# Patient Record
Sex: Male | Born: 1970 | Race: Black or African American | Hispanic: No | Marital: Married | Smoking: Never smoker
Health system: Southern US, Community
[De-identification: ages and names within clinical notes are randomized; demographics above are authoritative.]

## PROBLEM LIST (undated history)

## (undated) DIAGNOSIS — E785 Hyperlipidemia, unspecified: Secondary | ICD-10-CM

## (undated) DIAGNOSIS — I10 Essential (primary) hypertension: Secondary | ICD-10-CM

## (undated) HISTORY — DX: Essential (primary) hypertension: I10

## (undated) HISTORY — DX: Hyperlipidemia, unspecified: E78.5

## (undated) HISTORY — PX: ELBOW FRACTURE SURGERY: SHX616

---

## 1999-07-15 ENCOUNTER — Emergency Department (HOSPITAL_COMMUNITY): Admission: EM | Admit: 1999-07-15 | Discharge: 1999-07-15 | Payer: Self-pay | Admitting: Emergency Medicine

## 1999-12-16 ENCOUNTER — Encounter: Payer: Self-pay | Admitting: Emergency Medicine

## 1999-12-16 ENCOUNTER — Emergency Department (HOSPITAL_COMMUNITY): Admission: EM | Admit: 1999-12-16 | Discharge: 1999-12-16 | Payer: Self-pay | Admitting: Emergency Medicine

## 2001-07-05 ENCOUNTER — Emergency Department (HOSPITAL_COMMUNITY): Admission: EM | Admit: 2001-07-05 | Discharge: 2001-07-05 | Payer: Self-pay | Admitting: Emergency Medicine

## 2002-03-11 ENCOUNTER — Emergency Department (HOSPITAL_COMMUNITY): Admission: EM | Admit: 2002-03-11 | Discharge: 2002-03-12 | Payer: Self-pay | Admitting: Emergency Medicine

## 2003-04-01 ENCOUNTER — Encounter: Payer: Self-pay | Admitting: Emergency Medicine

## 2003-04-01 ENCOUNTER — Emergency Department (HOSPITAL_COMMUNITY): Admission: EM | Admit: 2003-04-01 | Discharge: 2003-04-01 | Payer: Self-pay | Admitting: Emergency Medicine

## 2005-06-28 ENCOUNTER — Ambulatory Visit: Payer: Self-pay | Admitting: Internal Medicine

## 2005-07-20 ENCOUNTER — Ambulatory Visit: Payer: Self-pay | Admitting: Internal Medicine

## 2005-09-26 ENCOUNTER — Ambulatory Visit: Payer: Self-pay | Admitting: Internal Medicine

## 2006-03-07 ENCOUNTER — Ambulatory Visit: Payer: Self-pay | Admitting: Internal Medicine

## 2008-04-23 ENCOUNTER — Ambulatory Visit: Payer: Self-pay | Admitting: Internal Medicine

## 2008-04-23 DIAGNOSIS — E119 Type 2 diabetes mellitus without complications: Secondary | ICD-10-CM | POA: Insufficient documentation

## 2008-04-23 DIAGNOSIS — I10 Essential (primary) hypertension: Secondary | ICD-10-CM | POA: Insufficient documentation

## 2008-04-23 LAB — CONVERTED CEMR LAB: Glucose, Bld: 180 mg/dL

## 2008-04-28 ENCOUNTER — Telehealth (INDEPENDENT_AMBULATORY_CARE_PROVIDER_SITE_OTHER): Payer: Self-pay | Admitting: *Deleted

## 2008-04-28 LAB — CONVERTED CEMR LAB
ALT: 19 units/L (ref 0–53)
AST: 22 units/L (ref 0–37)
Albumin: 4.4 g/dL (ref 3.5–5.2)
Alkaline Phosphatase: 83 units/L (ref 39–117)
BUN: 11 mg/dL (ref 6–23)
Bilirubin, Direct: 0.1 mg/dL (ref 0.0–0.3)
CO2: 25 meq/L (ref 19–32)
Calcium: 9.3 mg/dL (ref 8.4–10.5)
Chloride: 105 meq/L (ref 96–112)
Creatinine, Ser: 1.18 mg/dL (ref 0.40–1.50)
Glucose, Bld: 187 mg/dL — ABNORMAL HIGH (ref 70–99)
Hgb A1c MFr Bld: 7.5 % — ABNORMAL HIGH (ref 4.6–6.1)
Indirect Bilirubin: 0.6 mg/dL (ref 0.0–0.9)
Microalb, Ur: 86.5 mg/dL — ABNORMAL HIGH (ref 0.00–1.89)
Potassium: 3.8 meq/L (ref 3.5–5.3)
Sodium: 141 meq/L (ref 135–145)
Total Bilirubin: 0.7 mg/dL (ref 0.3–1.2)
Total Protein: 6.7 g/dL (ref 6.0–8.3)

## 2008-06-20 ENCOUNTER — Emergency Department (HOSPITAL_COMMUNITY): Admission: EM | Admit: 2008-06-20 | Discharge: 2008-06-20 | Payer: Self-pay | Admitting: Emergency Medicine

## 2008-06-25 ENCOUNTER — Ambulatory Visit: Payer: Self-pay | Admitting: Internal Medicine

## 2008-06-28 ENCOUNTER — Telehealth (INDEPENDENT_AMBULATORY_CARE_PROVIDER_SITE_OTHER): Payer: Self-pay | Admitting: *Deleted

## 2008-06-28 LAB — CONVERTED CEMR LAB
ALT: 46 units/L (ref 0–53)
AST: 41 units/L — ABNORMAL HIGH (ref 0–37)
BUN: 10 mg/dL (ref 6–23)
CO2: 32 meq/L (ref 19–32)
Calcium: 9.2 mg/dL (ref 8.4–10.5)
Chloride: 99 meq/L (ref 96–112)
Creatinine, Ser: 1.1 mg/dL (ref 0.4–1.5)
GFR calc Af Amer: 96 mL/min
GFR calc non Af Amer: 80 mL/min
Glucose, Bld: 106 mg/dL — ABNORMAL HIGH (ref 70–99)
Hgb A1c MFr Bld: 7.3 % — ABNORMAL HIGH (ref 4.6–6.0)
Potassium: 3.7 meq/L (ref 3.5–5.1)
Sodium: 140 meq/L (ref 135–145)

## 2008-07-13 ENCOUNTER — Telehealth: Payer: Self-pay | Admitting: Internal Medicine

## 2008-08-24 ENCOUNTER — Encounter (INDEPENDENT_AMBULATORY_CARE_PROVIDER_SITE_OTHER): Payer: Self-pay | Admitting: *Deleted

## 2008-11-12 ENCOUNTER — Ambulatory Visit: Payer: Self-pay | Admitting: Internal Medicine

## 2008-11-16 ENCOUNTER — Telehealth (INDEPENDENT_AMBULATORY_CARE_PROVIDER_SITE_OTHER): Payer: Self-pay | Admitting: *Deleted

## 2008-11-16 LAB — CONVERTED CEMR LAB
ALT: 23 units/L (ref 0–53)
AST: 24 units/L (ref 0–37)
BUN: 11 mg/dL (ref 6–23)
CO2: 27 meq/L (ref 19–32)
Calcium: 9.3 mg/dL (ref 8.4–10.5)
Chloride: 100 meq/L (ref 96–112)
Creatinine, Ser: 1.1 mg/dL (ref 0.4–1.5)
GFR calc Af Amer: 96 mL/min
GFR calc non Af Amer: 80 mL/min
Glucose, Bld: 292 mg/dL — ABNORMAL HIGH (ref 70–99)
Hgb A1c MFr Bld: 9.7 % — ABNORMAL HIGH (ref 4.6–6.0)
Potassium: 3.8 meq/L (ref 3.5–5.1)
Sodium: 137 meq/L (ref 135–145)

## 2008-11-18 ENCOUNTER — Telehealth (INDEPENDENT_AMBULATORY_CARE_PROVIDER_SITE_OTHER): Payer: Self-pay | Admitting: *Deleted

## 2008-11-19 ENCOUNTER — Telehealth (INDEPENDENT_AMBULATORY_CARE_PROVIDER_SITE_OTHER): Payer: Self-pay | Admitting: *Deleted

## 2008-11-26 ENCOUNTER — Encounter: Payer: Self-pay | Admitting: Internal Medicine

## 2009-01-05 ENCOUNTER — Ambulatory Visit: Payer: Self-pay | Admitting: Internal Medicine

## 2009-01-05 DIAGNOSIS — H0520 Unspecified exophthalmos: Secondary | ICD-10-CM | POA: Insufficient documentation

## 2009-01-09 ENCOUNTER — Emergency Department (HOSPITAL_COMMUNITY): Admission: EM | Admit: 2009-01-09 | Discharge: 2009-01-09 | Payer: Self-pay | Admitting: Emergency Medicine

## 2009-01-13 ENCOUNTER — Telehealth (INDEPENDENT_AMBULATORY_CARE_PROVIDER_SITE_OTHER): Payer: Self-pay | Admitting: *Deleted

## 2009-01-19 ENCOUNTER — Telehealth (INDEPENDENT_AMBULATORY_CARE_PROVIDER_SITE_OTHER): Payer: Self-pay | Admitting: *Deleted

## 2009-03-04 ENCOUNTER — Encounter (INDEPENDENT_AMBULATORY_CARE_PROVIDER_SITE_OTHER): Payer: Self-pay | Admitting: *Deleted

## 2009-05-23 ENCOUNTER — Encounter (INDEPENDENT_AMBULATORY_CARE_PROVIDER_SITE_OTHER): Payer: Self-pay | Admitting: *Deleted

## 2009-06-28 ENCOUNTER — Ambulatory Visit: Payer: Self-pay | Admitting: Internal Medicine

## 2009-07-04 LAB — CONVERTED CEMR LAB
ALT: 19 units/L (ref 0–53)
AST: 20 units/L (ref 0–37)
BUN: 15 mg/dL (ref 6–23)
CO2: 27 meq/L (ref 19–32)
Calcium: 9.5 mg/dL (ref 8.4–10.5)
Chloride: 107 meq/L (ref 96–112)
Creatinine, Ser: 1.3 mg/dL (ref 0.4–1.5)
GFR calc non Af Amer: 78.93 mL/min (ref >60)
Glucose, Bld: 105 mg/dL — ABNORMAL HIGH (ref 70–99)
Hgb A1c MFr Bld: 6.7 % — ABNORMAL HIGH (ref 4.6–6.5)
Potassium: 4.4 meq/L (ref 3.5–5.1)
Sodium: 142 meq/L (ref 135–145)

## 2009-07-29 ENCOUNTER — Ambulatory Visit: Payer: Self-pay | Admitting: Internal Medicine

## 2009-07-29 ENCOUNTER — Encounter (INDEPENDENT_AMBULATORY_CARE_PROVIDER_SITE_OTHER): Payer: Self-pay | Admitting: *Deleted

## 2009-09-30 ENCOUNTER — Ambulatory Visit: Payer: Self-pay | Admitting: Internal Medicine

## 2010-02-03 ENCOUNTER — Ambulatory Visit: Payer: Self-pay | Admitting: Internal Medicine

## 2010-02-07 ENCOUNTER — Telehealth (INDEPENDENT_AMBULATORY_CARE_PROVIDER_SITE_OTHER): Payer: Self-pay | Admitting: *Deleted

## 2010-02-10 ENCOUNTER — Telehealth (INDEPENDENT_AMBULATORY_CARE_PROVIDER_SITE_OTHER): Payer: Self-pay | Admitting: *Deleted

## 2010-02-20 ENCOUNTER — Ambulatory Visit: Payer: Self-pay | Admitting: Internal Medicine

## 2010-04-20 ENCOUNTER — Telehealth (INDEPENDENT_AMBULATORY_CARE_PROVIDER_SITE_OTHER): Payer: Self-pay | Admitting: *Deleted

## 2010-04-28 ENCOUNTER — Ambulatory Visit: Payer: Self-pay | Admitting: Internal Medicine

## 2010-05-05 LAB — CONVERTED CEMR LAB
ALT: 20 units/L (ref 0–53)
AST: 22 units/L (ref 0–37)
BUN: 12 mg/dL (ref 6–23)
CO2: 24 meq/L (ref 19–32)
Calcium: 9.4 mg/dL (ref 8.4–10.5)
Chloride: 104 meq/L (ref 96–112)
Cholesterol: 198 mg/dL (ref 0–200)
Creatinine, Ser: 1.1 mg/dL (ref 0.4–1.5)
GFR calc non Af Amer: 94.32 mL/min (ref >60)
Glucose, Bld: 110 mg/dL — ABNORMAL HIGH (ref 70–99)
HDL: 42.8 mg/dL (ref >39.00)
Hgb A1c MFr Bld: 6.3 % (ref 4.6–6.5)
LDL Cholesterol: 118 mg/dL — ABNORMAL HIGH (ref 0–99)
Potassium: 4.1 meq/L (ref 3.5–5.1)
Sodium: 138 meq/L (ref 135–145)
Total CHOL/HDL Ratio: 5
Triglycerides: 188 mg/dL — ABNORMAL HIGH (ref 0.0–149.0)
VLDL: 37.6 mg/dL (ref 0.0–40.0)

## 2010-06-09 ENCOUNTER — Ambulatory Visit: Payer: Self-pay | Admitting: Internal Medicine

## 2010-06-09 DIAGNOSIS — R42 Dizziness and giddiness: Secondary | ICD-10-CM | POA: Insufficient documentation

## 2010-06-12 LAB — CONVERTED CEMR LAB
Basophils Absolute: 0 10*3/uL (ref 0.0–0.1)
Eosinophils Absolute: 0.3 10*3/uL (ref 0.0–0.7)
Hemoglobin: 15 g/dL (ref 13.0–17.0)
Lymphocytes Relative: 26.8 % (ref 12.0–46.0)
MCHC: 34 g/dL (ref 30.0–36.0)
Monocytes Relative: 7.5 % (ref 3.0–12.0)
Neutro Abs: 5.3 10*3/uL (ref 1.4–7.7)
Neutrophils Relative %: 62 % (ref 43.0–77.0)
Platelets: 163 10*3/uL (ref 150.0–400.0)
RDW: 12.8 % (ref 11.5–14.6)
TSH: 1.93 microintl units/mL (ref 0.35–5.50)

## 2010-10-01 ENCOUNTER — Encounter: Payer: Self-pay | Admitting: Internal Medicine

## 2010-10-12 NOTE — Assessment & Plan Note (Signed)
Summary: 4 mth fu/ns/kdc   Vital Signs:  Patient profile:   40 year old male Height:      72 inches Weight:      280.2 pounds BMI:     38.14 Pulse rate:   64 / minute BP sitting:   142 / 96  Vitals Entered By: Dawson Bills (Feb 03, 2010 7:57 AM) CC: rov, FBS yesterday 100 Is Patient Diabetic? Yes   History of Present Illness: ROV doing well  diet has improved, has lost wt, appetite has decreased   Current Medications (verified): 1)  Metformin Hcl 850 Mg Tabs (Metformin Hcl) .Marland Kitchen.. 1 1/2 Tablets By Mouth Twice A Day 2)  Lisinopril 40 Mg  Tabs (Lisinopril) .... One Tablet By Mouth Daily 3)  Bayer Contour Test  Strp (Glucose Blood) .... Pt Test 2 Times A Day 4)  Bayer Microlet Lancets  Misc (Lancets) .... Pt Test 2 Times A Day 5)  Actos 30 Mg Tabs (Pioglitazone Hcl) .Marland Kitchen.. 1 By Mouth Once Daily  Allergies (verified): 1)  ! Onglyza (Saxagliptin Hcl)  Past History:  Past Medical History: Reviewed history from 04/23/2008 and no changes required. Diabetes mellitus, type II--Dx aprox 2004 Hypertension  Past Surgical History: Reviewed history from 04/23/2008 and no changes required. left elbow Fx as a child--surgery  Social History: Reviewed history from 06/28/2009 and no changes required. Married 2 kids very active diet-- tries to follow a diabetic diet  Never Smoked Alcohol use-no Drug use-no Regular exercise-no but active  Occupation: fork Cytogeneticist   Review of Systems       ambulatory CBGs around 100 no frecuent ambulatory BPs , usually "normal" CV:  Denies difficulty breathing at night and swelling of feet. GI:  Denies diarrhea, nausea, and vomiting.  Physical Exam  General:  alert and well-developed.   Lungs:  normal respiratory effort, no intercostal retractions, no accessory muscle use, and normal breath sounds.   Heart:  normal rate, regular rhythm, no murmur, and no gallop.   Extremities:  no pretibial edema bilaterally  Psych:  not anxious  appearing and not depressed appearing.     Impression & Recommendations:  Problem # 1:  HYPERTENSION (ICD-401.9) well controlled? add HCTZ, see instructions  His updated medication list for this problem includes:    Lisinopril-hydrochlorothiazide 20-12.5 Mg Tabs (Lisinopril-hydrochlorothiazide) .Marland Kitchen... 2 by mouth in am  BP today: 142/96 Prior BP: 120/80 (09/30/2009)  Labs Reviewed: K+: 4.4 (06/28/2009) Creat: : 1.3 (06/28/2009)     Problem # 2:  DIABETES MELLITUS, TYPE II (ICD-250.00) labs, see instructions  His updated medication list for this problem includes:    Metformin Hcl 850 Mg Tabs (Metformin hcl) .Marland Kitchen... 1 1/2 tablets by mouth twice a day    Lisinopril-hydrochlorothiazide 20-12.5 Mg Tabs (Lisinopril-hydrochlorothiazide) .Marland Kitchen... 2 by mouth in am    Actos 30 Mg Tabs (Pioglitazone hcl) .Marland Kitchen... 1 by mouth once daily  Labs Reviewed: Creat: 1.3 (06/28/2009)    Reviewed HgBA1c results: 6.7 (06/28/2009)  9.7 (11/12/2008)  Complete Medication List: 1)  Metformin Hcl 850 Mg Tabs (Metformin hcl) .Marland Kitchen.. 1 1/2 tablets by mouth twice a day 2)  Lisinopril-hydrochlorothiazide 20-12.5 Mg Tabs (Lisinopril-hydrochlorothiazide) .... 2 by mouth in am 3)  Bayer Contour Test Strp (Glucose blood) .... Pt test 2 times a day 4)  Bayer Microlet Lancets Misc (Lancets) .... Pt test 2 times a day 5)  Actos 30 Mg Tabs (Pioglitazone hcl) .Marland Kitchen.. 1 by mouth once daily  Patient Instructions: 1)  take the "new" lisinopril-HCT 2  in AM 2)  came back in 2 weeks FASTING  for a BP check and labs: 3)  BMP AST ALT A1C FLP  ---dx DM 4)  Please schedule a follow-up appointment in 4 months .  Prescriptions: ACTOS 30 MG TABS (PIOGLITAZONE HCL) 1 by mouth once daily  #30.0 Each x 4   Entered by:   Dawson Bills   Authorized by:   Alda Berthold. Shaunn Tackitt MD   Signed by:   Dawson Bills on 02/03/2010   Method used:   Electronically to        [facility]. [id number]* (retail)       [place], [place]   [address]       Ph: [phone]       Fax: [phone]   RxID:   [id number] LISINOPRIL-HYDROCHLOROTHIAZIDE 20-12.5 MG TABS (LISINOPRIL-HYDROCHLOROTHIAZIDE) 2 by mouth in AM  #60 x 3   Entered and Authorized by:   Alda Berthold. Bryana Froemming MD   Signed by:   Alda Berthold. Leshia Kope MD on 02/03/2010   Method used:   Print then Give to Patient   RxID:   [id number]

## 2010-10-12 NOTE — Assessment & Plan Note (Signed)
Summary: 4 month roa//lch   Vital Signs:  Patient profile:   40 year old male Weight:      279.13 pounds Pulse rate:   89 / minute Pulse rhythm:   regular BP sitting:   130 / 84  (left arm) Cuff size:   large  Vitals Entered By: Allyn Kenner CMA (June 09, 2010 8:42 AM) CC: Follow up, not fasting (had a peach) Comments -Dizzy Spells- BS averaging around 108. Pharm- [facility] Declines flu shot    History of Present Illness: routine office visit complaining of dizziness from time to time, usually when he stands up, described as a  "imbalance feeling",  lasts few seconds. No associated diplopia or slurred speech Has not checked his BP or blood sugar when that happens.  review of systems  ambulatory CBGs in the 108 range no recent ambulatory BPs   no CP, SOB, palpitations     Current Medications (verified): 1)  Metformin Hcl 850 Mg Tabs (Metformin Hcl) .Marland Kitchen.. 1 1/2 Tablets By Mouth Twice A Day 2)  Lisinopril 40 Mg Tabs (Lisinopril) .... Take 1 Tab Once Daily 3)  Bayer Contour Test  Strp (Glucose Blood) .... Pt Test 2 Times A Day 4)  Bayer Microlet Lancets  Misc (Lancets) .... Pt Test 2 Times A Day 5)  Actos 30 Mg Tabs (Pioglitazone Hcl) .Marland Kitchen.. 1 By Mouth Once Daily  Allergies: 1)  ! Onglyza (Saxagliptin Hcl)  Past History:  Past Medical History: Reviewed history from 04/23/2008 and no changes required. Diabetes mellitus, type II--Dx aprox 2004 Hypertension  Past Surgical History: Reviewed history from 04/23/2008 and no changes required. left elbow Fx as a child--surgery  Social History: Reviewed history from 06/28/2009 and no changes required. Married 2 kids very active diet-- tries to follow a diabetic diet  Never Smoked Alcohol use-no Drug use-no Regular exercise-no but active  Occupation: Sports administrator   Physical Exam  General:  alert and well-developed.   Lungs:  normal respiratory effort, no intercostal retractions, no accessory  muscle use, and normal breath sounds.   Heart:  normal rate, regular rhythm, no murmur, and no gallop.   Neurologic:  alert & oriented X3, strength normal in all extremities, and gait normal.   Psych:  not anxious appearing and not depressed appearing.     Impression & Recommendations:  Problem # 1:  HYPERTENSION (ICD-401.9) at goal  The following medications were removed from the medication list:    Hydrochlorothiazide 25 Mg Tabs (Hydrochlorothiazide) .Marland Kitchen... Take  half tablet daily His updated medication list for this problem includes:    Lisinopril 40 Mg Tabs (Lisinopril) .Marland Kitchen... Take 1 tab once daily  BP today: 130/84 Prior BP: 142/96 (02/03/2010)  Labs Reviewed: K+: 4.1 (04/28/2010) Creat: : 1.1 (04/28/2010)   Chol: 198 (04/28/2010)   HDL: 42.80 (04/28/2010)   LDL: 118 (04/28/2010)   TG: 188.0 (04/28/2010)  Orders: Venipuncture [id number]) TLB-TSH (Thyroid Stimulating Hormone) (84443-TSH) TLB-CBC Platelet - w/Differential (85025-CBCD) Specimen Handling (99000)  Problem # 2:  DIABETES MELLITUS, TYPE II (ICD-250.00) at goal Discussed diet and exercise, printed material was provided His updated medication list for this problem includes:    Metformin Hcl 850 Mg Tabs (Metformin hcl) .Marland Kitchen... 1 1/2 tablets by mouth twice a day    Lisinopril 40 Mg Tabs (Lisinopril) .Marland Kitchen... Take 1 tab once daily    Actos 30 Mg Tabs (Pioglitazone hcl) .Marland Kitchen... 1 by mouth once daily  Labs Reviewed: Creat: 1.1 (04/28/2010)    Reviewed HgBA1c results: 6.3 (  04/28/2010)  6.7 (06/28/2009)  Problem # 3:  DIZZINESS (ICD-780.4) see description of symptoms at the history of present illness Recommend observation. Patient will call if symptoms increase  Problem # 4:  Will get his flu shot at work for free  Complete Medication List: 1)  Metformin Hcl 850 Mg Tabs (Metformin hcl) .Marland Kitchen.. 1 1/2 tablets by mouth twice a day 2)  Lisinopril 40 Mg Tabs (Lisinopril) .... Take 1 tab once daily 3)  Bayer Contour Test Strp  (Glucose blood) .... Pt test 2 times a day 4)  Bayer Microlet Lancets Misc (Lancets) .... Pt test 2 times a day 5)  Actos 30 Mg Tabs (Pioglitazone hcl) .Marland Kitchen.. 1 by mouth once daily  Patient Instructions: 1)  Please schedule a follow-up appointment in 4 months, fasting , physical

## 2010-10-12 NOTE — Progress Notes (Signed)
Summary: BP med  Phone Note Call from Patient Call back at [facility] Phone [phone] Call back at Work Phone [phone]   Caller: Patient Reason for Call: Talk to Nurse, Talk to Doctor Summary of Call: Patient says his new rx for his high blood pressure is making him dizzy. Cell  number is [phone]. Initial call taken by: Elna Breslow,  February 10, 2010 10:47 AM  Follow-up for Phone Call        Tried to call pt at home, left a message. Tried pts cell phone twice, states cell phone was not in service. Allyn Kenner CMA  February 10, 2010 10:55 AM   Additional Follow-up for Phone Call Additional follow up Details #1::        Tried calling pts cell phone again, states it out of service. I called and left another message on the home phone. Allyn Kenner CMA  February 10, 2010 2:03 PM     Additional Follow-up for Phone Call Additional follow up Details #2::    Pt states that he is taking 2 pills in the am, and it is making him dizzy almost like he wants to pass out, he states he has no energy. Pt states he has not checked his BP to see how it was. He said this a.m. he took 1 pill instead of 2 and he is feeling slightly better. Please advise. [place]  February 10, 2010 3:48 PM  he is probably taking too much HCTZ Plan: go back to his old lisinopril 40 milligrams ---one a day add HCTZ 25 mg-- half tablet daily plan back in 2 weeks FASTING  for a BP check and labs: BMP AST ALT A1C FLP  ---dx DM Jose E. Paz MD  February 10, 2010 4:55 PM    Additional Follow-up for Phone Call Additional follow up Details #3:: Details for Additional Follow-up Action Taken: DISCUSS WITH PATIENT..................Marland KitchenFelecia Deloach CMA  February 10, 2010 5:06 PM   ---please call pt to schedule appt 2 weeks FASTING  for a BP check and labs: BMP AST ALT A1C FLP  ---dx DM.Felecia Deloach CMA  February 13, 2010 9:35 AM.    Patient is coming in on 6.13.11. Elna Breslow  February 14, 2010 9:29 AM  New/Updated  Medications: LISINOPRIL 40 MG TABS (LISINOPRIL) Take 1 tab once daily HYDROCHLOROTHIAZIDE 25 MG TABS (HYDROCHLOROTHIAZIDE) Take  half tablet daily Prescriptions: HYDROCHLOROTHIAZIDE 25 MG TABS (HYDROCHLOROTHIAZIDE) Take  half tablet daily  #15 x 0   Entered by:   Rolla Flatten CMA   Authorized by:   Alda Berthold. Paz MD   Signed by:   Rolla Flatten CMA on 02/10/2010   Method used:   Faxed to ...       [facility] [address] [id number]* (retail)       [place], [place]  [address]       Ph: [phone]       Fax: [phone]   RxID:   [id number] LISINOPRIL 40 MG TABS (LISINOPRIL) Take 1 tab once daily  #30 x 0   Entered by:   Rolla Flatten CMA   Authorized by:   Alda Berthold. Paz MD   Signed by:   Rolla Flatten CMA on 02/10/2010   Method used:   Faxed to ...       [facility] [address] [id number]* (retail)       [address]  [place], [place]  [address]       Ph: [phone]       Fax: [phone]   RxID:   [id number]

## 2010-10-12 NOTE — Assessment & Plan Note (Signed)
Summary: 3 MONTH FOLLOWUP///SPH   Vital Signs:  Patient profile:   40 year old male Height:      72 inches Weight:      286.4 pounds Pulse rate:   68 / minute BP sitting:   120 / 80  Vitals Entered By: Dawson Bills (September 30, 2009 8:05 AM) CC: rov - not fasting Is Patient Diabetic? No Comments  - BLOOD SUGAR READINGS @ HOME: 100-120  - samples of actos given to pt [facility]  September 30, 2009 8:10 AM    History of Present Illness: follow-up on diabetes couldn't tolerate onglyza  it caused nausea   Current Medications (verified): 1)  Metformin Hcl 850 Mg Tabs (Metformin Hcl) .Marland Kitchen.. 1 1/2 Tablets By Mouth Twice A Day 2)  Lisinopril 40 Mg  Tabs (Lisinopril) .... One Tablet By Mouth Daily 3)  Bayer Contour Test  Strp (Glucose Blood) .... Pt Test 2 Times A Day 4)  Bayer Microlet Lancets  Misc (Lancets) .... Pt Test 2 Times A Day 5)  Actos 30 Mg Tabs (Pioglitazone Hcl) .Marland Kitchen.. 1 By Mouth Once Daily  Allergies (verified): 1)  ! Onglyza (Saxagliptin Hcl)  Past History:  Past Medical History: Reviewed history from 04/23/2008 and no changes required. Diabetes mellitus, type II--Dx aprox 2004 Hypertension  Past Surgical History: Reviewed history from 04/23/2008 and no changes required. left elbow Fx as a child--surgery  Social History: Reviewed history from 06/28/2009 and no changes required. Married 2 kids very active diet-- tries to follow a diabetic diet  Never Smoked Alcohol use-no Drug use-no Regular exercise-no but active  Occupation: Sports administrator   Review of Systems General:  denies low sugars symptoms however he sometimes wake up in the middle of the night for no reason.  Denies nightmares or sweats continued to be very active ambulatory sugars in the 100 to 120  has not been able to see the nutritionist but plans to do that.  Physical Exam  General:  alert and well-developed.   Lungs:  normal respiratory effort, no intercostal retractions, no  accessory muscle use, and normal breath sounds.   Heart:  normal rate, regular rhythm, no murmur, and no gallop.   Extremities:  no pretibial edema bilaterally    Impression & Recommendations:  Problem # 1:  DIABETES MELLITUS, TYPE II (ICD-250.00) couldn't tolerate onglyza  it caused nausea back on Actos, apparently doing well encouraged him to see the nutritionist encouraged to call us if he needs Actos samples labs His updated medication list for this problem includes:    Metformin Hcl 850 Mg Tabs (Metformin hcl) .Marland Kitchen... 1 1/2 tablets by mouth twice a day    Lisinopril 40 Mg Tabs (Lisinopril) ..... One tablet by mouth daily    Actos 30 Mg Tabs (Pioglitazone hcl) .Marland Kitchen... 1 by mouth once daily  Problem # 2:  HYPERTENSION (ICD-401.9) at goal  His updated medication list for this problem includes:    Lisinopril 40 Mg Tabs (Lisinopril) ..... One tablet by mouth daily  BP today: 120/80 Prior BP: 140/102 (06/28/2009)  Labs Reviewed: K+: 4.4 (06/28/2009) Creat: : 1.3 (06/28/2009)     Complete Medication List: 1)  Metformin Hcl 850 Mg Tabs (Metformin hcl) .Marland Kitchen.. 1 1/2 tablets by mouth twice a day 2)  Lisinopril 40 Mg Tabs (Lisinopril) .... One tablet by mouth daily 3)  Bayer Contour Test Strp (Glucose blood) .... Pt test 2 times a day 4)  Bayer Microlet Lancets Misc (Lancets) .... Pt test 2 times  a day 5)  Actos 30 Mg Tabs (Pioglitazone hcl) .Marland Kitchen.. 1 by mouth once daily  Patient Instructions: 1)  came back fasting for labs next week: 2)  A1C, microalb, AST, ALT, FLP  DX DM 3)   CBC  Dx HTN 4)  Please schedule a follow-up appointment in 4 months .

## 2010-10-12 NOTE — Progress Notes (Signed)
Summary: lost rx  Phone Note Call from Patient   Refills Requested: Medication #1:  LISINOPRIL-HYDROCHLOROTHIAZIDE 20-12.5 MG TABS 2 by mouth in AM patient was given  Caller: Patient Summary of Call: patient was given new rx for lisinopril - he said it blew out of the window -[facility] [address]  Initial call taken by: Arbie [place],  Feb 07, 2010 10:03 AM    Prescriptions: LISINOPRIL-HYDROCHLOROTHIAZIDE 20-12.5 MG TABS (LISINOPRIL-HYDROCHLOROTHIAZIDE) 2 by mouth in AM  #60 x 3   Entered by:   Dawson Bills   Authorized by:   Alda Berthold. Paz MD   Signed by:   Dawson Bills on 02/07/2010   Method used:   Electronically to        [facility]. [id number]* (retail)       [place], [place]  [address]       Ph: [phone]       Fax: [phone]   RxID:   [id number]

## 2010-10-12 NOTE — Progress Notes (Signed)
Summary: labs  Phone Note Outgoing Call   Call placed by: Allyn Kenner CMA,  April 20, 2010 8:12 AM Summary of Call: Please call pt and schedule labs:  BMP AST ALT A1C FLP  ---dx DM.  Follow-up for Phone Call        [place]. WILL TRY AGAIN.Marland KitchenKathie Dike Negrete  April 20, 2010 12:06 PM  Additional Follow-up for Phone Call Additional follow up Details #1::        lmtcb.Kathie Dike Negrete  April 24, 2010 11:07 AM    Additional Follow-up for Phone Call Additional follow up Details #2::    letter mailed.Kathie Dike Negrete  April 25, 2010 3:02 PM

## 2010-10-13 ENCOUNTER — Encounter (INDEPENDENT_AMBULATORY_CARE_PROVIDER_SITE_OTHER): Payer: BC Managed Care – PPO | Admitting: Internal Medicine

## 2010-10-13 ENCOUNTER — Encounter: Payer: Self-pay | Admitting: Internal Medicine

## 2010-10-13 ENCOUNTER — Other Ambulatory Visit: Payer: Self-pay | Admitting: Internal Medicine

## 2010-10-13 ENCOUNTER — Ambulatory Visit: Admit: 2010-10-13 | Payer: Self-pay | Admitting: Internal Medicine

## 2010-10-13 ENCOUNTER — Encounter (INDEPENDENT_AMBULATORY_CARE_PROVIDER_SITE_OTHER): Payer: Self-pay | Admitting: *Deleted

## 2010-10-13 DIAGNOSIS — Z0000 Encounter for general adult medical examination without abnormal findings: Secondary | ICD-10-CM

## 2010-10-13 DIAGNOSIS — E119 Type 2 diabetes mellitus without complications: Secondary | ICD-10-CM

## 2010-10-13 DIAGNOSIS — Z136 Encounter for screening for cardiovascular disorders: Secondary | ICD-10-CM

## 2010-10-13 LAB — LIPID PANEL
Cholesterol: 174 mg/dL (ref 0–200)
LDL Cholesterol: 104 mg/dL — ABNORMAL HIGH (ref 0–99)
Triglycerides: 132 mg/dL (ref 0.0–149.0)

## 2010-10-13 LAB — HEMOGLOBIN A1C: Hgb A1c MFr Bld: 6.6 % — ABNORMAL HIGH (ref 4.6–6.5)

## 2010-10-13 LAB — BASIC METABOLIC PANEL
CO2: 25 mEq/L (ref 19–32)
Chloride: 107 mEq/L (ref 96–112)
Creatinine, Ser: 1.2 mg/dL (ref 0.4–1.5)
Potassium: 4 mEq/L (ref 3.5–5.1)
Sodium: 141 mEq/L (ref 135–145)

## 2010-10-13 LAB — MICROALBUMIN / CREATININE URINE RATIO
Creatinine,U: 146.4 mg/dL
Microalb, Ur: 19.9 mg/dL — ABNORMAL HIGH (ref 0.0–1.9)

## 2010-10-13 LAB — AST: AST: 20 U/L (ref 0–37)

## 2010-10-18 ENCOUNTER — Encounter: Payer: BC Managed Care – PPO | Admitting: Physician Assistant

## 2010-10-18 NOTE — Assessment & Plan Note (Signed)
Summary: CPX AND LABS/CBS/PH   Vital Signs:  Patient profile:   40 year old male Height:      72 inches Weight:      277.38 pounds Pulse rate:   86 / minute Pulse rhythm:   regular BP sitting:   130 / 84  (left arm) Cuff size:   large  Vitals Entered By: Allyn Kenner CMA (October 13, 2010 7:54 AM) CC: CPX, fasting  Is Patient Diabetic? Yes Comments no complaints BS running good he states    History of Present Illness: CPX  patient is concerned about heart disease, a friend of him had a  heart attack. He is concerned because his family history and diabetes  Current Medications (verified): 1)  Metformin Hcl 850 Mg Tabs (Metformin Hcl) .Marland Kitchen.. 1 1/2 Tablets By Mouth Twice A Day 2)  Lisinopril 40 Mg Tabs (Lisinopril) .... Take 1 Tab Once Daily 3)  Bayer Contour Test  Strp (Glucose Blood) .... Pt Test 2 Times A Day 4)  Bayer Microlet Lancets  Misc (Lancets) .... Pt Test 2 Times A Day 5)  Actos 30 Mg Tabs (Pioglitazone Hcl) .Marland Kitchen.. 1 By Mouth Once Daily  Allergies (verified): 1)  ! Onglyza (Saxagliptin Hcl)  Past History:  Past Medical History: Reviewed history from 04/23/2008 and no changes required. Diabetes mellitus, type II--Dx aprox 2004 Hypertension  Past Surgical History: Reviewed history from 04/23/2008 and no changes required. left elbow Fx as a child--surgery  Family History: CAD - GM (in her 68s) HTN - M DM - M, F stroke - aunt colon Ca - M, dx in her 47s  prostate Ca - no  Social History: Married 2 kids  Never Smoked Alcohol use-no Drug use-no Regular exercise--- very active @ works, trying to walk some   diet-- tries to follow a diabetic diet , has lost 2 pounds Occupation: Sports administrator   Review of Systems General:  Denies fatigue and fever. CV:  Denies chest pain or discomfort, palpitations, and swelling of feet. Resp:  Denies cough and shortness of breath. GI:  Denies bloody stools, diarrhea, and nausea. GU:  Denies dysuria, hematuria,  urinary frequency, and urinary hesitancy.  Physical Exam  General:  alert, well-developed, and overweight-appearing.   Neck:  no masses and no thyromegaly.   Lungs:  normal respiratory effort, no intercostal retractions, no accessory muscle use, and normal breath sounds.   Heart:  normal rate, regular rhythm, no murmur, and no gallop.   Abdomen:  soft, non-tender, no distention, and no masses.  no hepatomegaly and no splenomegaly.   Extremities:  no pretibial edema bilaterally  Psych:  Oriented X3, memory intact for recent and remote, normally interactive, good eye contact, not anxious appearing, and not depressed appearing.     Impression & Recommendations:  Problem # 1:  HEALTH SCREENING (ICD-V70.0) Td 10-10 flu shot -- had it @ work  Ramirez-Perez colon Ca - M, dx in her 35s ----start with a iFOB this year, discussed the option of colonoscopy.  Patient is quite concerned about CAD, he has diabetes, high blood pressure. We discussed a stress test and he likes to proceed  EKG today without acute changes. Labs  Orders: Venipuncture [id number]) TLB-BMP (Basic Metabolic Panel-BMET) (99991111) TLB-Lipid Panel (80061-LIPID) TLB-ALT (SGPT) (84460-ALT) TLB-AST (SGOT) (84450-SGOT) EKG w/ Interpretation (93000) Specimen Handling (99000) Cardiology Referral (Cardiology)  Problem # 2:  DIABETES MELLITUS, TYPE II (ICD-250.00) has improved his lifestyle lately, see social history last LDL was 118,  goal 100  or less.  His updated medication list for this problem includes:    Metformin Hcl 850 Mg Tabs (Metformin hcl) .Marland Kitchen... 1 1/2 tablets by mouth twice a day    Lisinopril 40 Mg Tabs (Lisinopril) .Marland Kitchen... Take 1 tab once daily    Actos 30 Mg Tabs (Pioglitazone hcl) .Marland Kitchen... 1 by mouth once daily    Labs Reviewed: Creat: 1.1 (04/28/2010)    Reviewed HgBA1c results: 6.3 (04/28/2010)  6.7 (06/28/2009)  Orders: TLB-A1C / Hgb A1C (Glycohemoglobin) (83036-A1C) TLB-Microalbumin/Creat Ratio, Urine  (82043-MALB) Specimen Handling (99000)  Problem # 3:  HYPERTENSION (ICD-401.9) no change His updated medication list for this problem includes:    Lisinopril 40 Mg Tabs (Lisinopril) .Marland Kitchen... Take 1 tab once daily  BP today: 130/84 Prior BP: 130/84 (06/09/2010)  Labs Reviewed: K+: 4.1 (04/28/2010) Creat: : 1.1 (04/28/2010)   Chol: 198 (04/28/2010)   HDL: 42.80 (04/28/2010)   LDL: 118 (04/28/2010)   TG: 188.0 (04/28/2010)  Complete Medication List: 1)  Metformin Hcl 850 Mg Tabs (Metformin hcl) .Marland Kitchen.. 1 1/2 tablets by mouth twice a day 2)  Lisinopril 40 Mg Tabs (Lisinopril) .... Take 1 tab once daily 3)  Bayer Contour Test Strp (Glucose blood) .... Pt test 2 times a day 4)  Bayer Microlet Lancets Misc (Lancets) .... Pt test 2 times a day 5)  Actos 30 Mg Tabs (Pioglitazone hcl) .Marland Kitchen.. 1 by mouth once daily  Patient Instructions: 1)  Please schedule a follow-up appointment in 4 months .  Prescriptions: ACTOS 30 MG TABS (PIOGLITAZONE HCL) 1 by mouth once daily  #30.0 Each x 5   Entered by:   Allyn Kenner CMA   Authorized by:   Alda Berthold. Ashraf Mesta MD   Signed by:   Allyn Kenner CMA on 10/13/2010   Method used:   Electronically to        [facility]. [id number]* (retail)       [address]       [place], [place]  [address]       Ph: [phone] or [phone]       Fax: [phone]   RxID:   [id number] LISINOPRIL 40 MG TABS (LISINOPRIL) Take 1 tab once daily  #30 Tablet x 5   Entered by:   Allyn Kenner CMA   Authorized by:   Alda Berthold. Fateh Kindle MD   Signed by:   Allyn Kenner CMA on 10/13/2010   Method used:   Electronically to        [facility]. [id number]* (retail)       [address]       [place], [place]  [address]       Ph: [phone] or [phone]       Fax: [phone]   RxID:   [id number] METFORMIN HCL 850 MG TABS (METFORMIN HCL) 1 1/2 tablets by mouth twice a day  #90 Tablet x 2   Entered by:   Allyn Kenner CMA    Authorized by:   Alda Berthold. Talli Kimmer MD   Signed by:   Allyn Kenner CMA on 10/13/2010   Method used:   Electronically to        [facility]. [id number]* (retail)       [address]       [place], [place]  [address]       Ph: [phone] or  [phone]       Fax: [phone]   RxIDIN:[id number]    Orders Added: 1)  Venipuncture [id number] 2)  TLB-BMP (Basic Metabolic Panel-BMET) 123456 3)  TLB-A1C / Hgb A1C (Glycohemoglobin) [83036-A1C] 4)  TLB-Microalbumin/Creat Ratio, Urine [82043-MALB] 5)  TLB-Lipid Panel [80061-LIPID] 6)  TLB-ALT (SGPT) [84460-ALT] 7)  TLB-AST (SGOT) [84450-SGOT] 8)  EKG w/ Interpretation [93000] 9)  Specimen Handling [99000] 10)  Cardiology Referral [Cardiology] 11)  Est. Patient age 64-64 [99396]

## 2010-10-18 NOTE — Letter (Signed)
Summary: [facility]: Immunoassay Fecal Occult Blood (iFOB) Order Form  [facility]   [place], [place] [address]   Phone: [phone]  Fax: [phone]      [facility]: Immunoassay Fecal Occult Blood (iFOB) Order Form   October 13, 2010 MRN: [id number]   Charles Yoder 31-Jul-1971   Physicican Name:__jose paz,md_______________________  Diagnosis Code:____v76.51______________________      Allyn Kenner CMA

## 2010-10-27 ENCOUNTER — Encounter: Payer: BC Managed Care – PPO | Admitting: Physician Assistant

## 2010-12-08 ENCOUNTER — Other Ambulatory Visit: Payer: BC Managed Care – PPO

## 2010-12-08 DIAGNOSIS — Z1211 Encounter for screening for malignant neoplasm of colon: Secondary | ICD-10-CM

## 2010-12-19 LAB — GLUCOSE, CAPILLARY: Glucose-Capillary: 159 mg/dL — ABNORMAL HIGH (ref 70–99)

## 2010-12-25 ENCOUNTER — Telehealth: Payer: Self-pay | Admitting: Internal Medicine

## 2010-12-25 MED ORDER — LISINOPRIL 40 MG PO TABS: 40.0000 mg | ORAL_TABLET | Freq: Every day | ORAL | Status: DC

## 2010-12-25 NOTE — Telephone Encounter (Signed)
Patient called his pharmacy and they wont refill because he has no refill left---please refill 30 day  prescription for Lisinopril at [facility] [address], [facility]

## 2011-02-02 ENCOUNTER — Other Ambulatory Visit: Payer: Self-pay | Admitting: Internal Medicine

## 2011-04-24 ENCOUNTER — Other Ambulatory Visit: Payer: Self-pay | Admitting: Internal Medicine

## 2011-04-24 NOTE — Telephone Encounter (Signed)
Ok 1 month supply and 1 RF Also, the patient he is due for an  office visit. Please arrange

## 2011-05-03 ENCOUNTER — Encounter: Payer: Self-pay | Admitting: Internal Medicine

## 2011-05-04 ENCOUNTER — Ambulatory Visit: Payer: BC Managed Care – PPO | Admitting: Internal Medicine

## 2011-05-04 DIAGNOSIS — Z029 Encounter for administrative examinations, unspecified: Secondary | ICD-10-CM

## 2011-06-04 ENCOUNTER — Other Ambulatory Visit: Payer: Self-pay | Admitting: Internal Medicine

## 2011-06-04 NOTE — Telephone Encounter (Signed)
Done

## 2011-06-11 LAB — GLUCOSE, CAPILLARY: Glucose-Capillary: 162 — ABNORMAL HIGH

## 2011-07-03 ENCOUNTER — Ambulatory Visit (INDEPENDENT_AMBULATORY_CARE_PROVIDER_SITE_OTHER): Payer: BC Managed Care – PPO | Admitting: Internal Medicine

## 2011-07-03 ENCOUNTER — Encounter: Payer: Self-pay | Admitting: Internal Medicine

## 2011-07-03 VITALS — BP 130/90 | HR 95 | Temp 98.7°F | Ht 73.0 in | Wt 274.0 lb

## 2011-07-03 DIAGNOSIS — E119 Type 2 diabetes mellitus without complications: Secondary | ICD-10-CM

## 2011-07-03 DIAGNOSIS — I10 Essential (primary) hypertension: Secondary | ICD-10-CM

## 2011-07-03 LAB — CBC WITH DIFFERENTIAL/PLATELET
Basophils Absolute: 0 10*3/uL (ref 0.0–0.1)
Basophils Relative: 0.4 % (ref 0.0–3.0)
Eosinophils Absolute: 0.2 10*3/uL (ref 0.0–0.7)
HCT: 44.1 % (ref 39.0–52.0)
Hemoglobin: 14.9 g/dL (ref 13.0–17.0)
Lymphs Abs: 2.5 10*3/uL (ref 0.7–4.0)
MCHC: 33.8 g/dL (ref 30.0–36.0)
MCV: 92.2 fl (ref 78.0–100.0)
Neutro Abs: 5.9 10*3/uL (ref 1.4–7.7)
RBC: 4.79 Mil/uL (ref 4.22–5.81)
RDW: 12.5 % (ref 11.5–14.6)

## 2011-07-03 LAB — BASIC METABOLIC PANEL
BUN: 15 mg/dL (ref 6–23)
CO2: 25 mEq/L (ref 19–32)
Calcium: 9.4 mg/dL (ref 8.4–10.5)
Creatinine, Ser: 1.3 mg/dL (ref 0.4–1.5)
GFR: 78.13 mL/min (ref >60.00)
Glucose, Bld: 113 mg/dL — ABNORMAL HIGH (ref 70–99)
Sodium: 140 mEq/L (ref 135–145)

## 2011-07-03 NOTE — Assessment & Plan Note (Signed)
Well controlled? DBP 90 today, no amb BPs See instructions

## 2011-07-03 NOTE — Assessment & Plan Note (Signed)
Well controlled , labs  Eye check per pt ~ 05-2011 : normal

## 2011-07-03 NOTE — Progress Notes (Signed)
  Subjective:    Patient ID: Charles Yoder, male    DOB: 05-07-1971, 40 y.o.   MRN: [id number]  HPI Routine office visit Needs paperwork filled for his job. Diabetes--good medication compliance, ambulatory CBGs around 120 Hypertension--good medication compliance, no ambulatory blood pressures He did have his flu shot  Already  Past Medical History  Diagnosis Date  . Diabetes mellitus 2004    type 2  . Hypertension    Past Surgical History  Procedure Date  . Elbow fracture surgery as a child    left     Review of Systems Denies CP or shortness of breath No nausea, vomiting, diarrhea A month ago had his eyes checked, apparently they did a funduscopy and it was normal per pt     Objective:   Physical Exam  Constitutional: He is oriented to person, place, and time. He appears well-developed. No distress.  HENT:  Head: Normocephalic and atraumatic.  Cardiovascular: Normal rate, regular rhythm and normal heart sounds.   No murmur heard. Pulmonary/Chest: Effort normal and breath sounds normal. No respiratory distress. He has no wheezes. He has no rales.  Neurological: He is alert and oriented to person, place, and time.  Skin: He is not diaphoretic.  Psychiatric: He has a normal mood and affect. His behavior is normal. Judgment and thought content normal.          Assessment & Plan:  Today we completed the paperwork at request of the patient, face-to-face time was more than 25 minutes due to the  visit and paperwork

## 2011-07-03 NOTE — Patient Instructions (Signed)
Check the  blood pressure 2 or 3 times a week, be sure it is less than 140/85. If it is consistently higher, let me know

## 2011-08-23 ENCOUNTER — Other Ambulatory Visit: Payer: Self-pay | Admitting: Internal Medicine

## 2011-10-11 ENCOUNTER — Other Ambulatory Visit: Payer: Self-pay | Admitting: Internal Medicine

## 2011-10-24 ENCOUNTER — Other Ambulatory Visit: Payer: Self-pay | Admitting: Internal Medicine

## 2011-10-24 NOTE — Telephone Encounter (Signed)
Refill done.  

## 2011-11-09 ENCOUNTER — Ambulatory Visit (INDEPENDENT_AMBULATORY_CARE_PROVIDER_SITE_OTHER): Payer: BC Managed Care – PPO | Admitting: Internal Medicine

## 2011-11-09 DIAGNOSIS — Z0000 Encounter for general adult medical examination without abnormal findings: Secondary | ICD-10-CM

## 2011-11-09 DIAGNOSIS — I10 Essential (primary) hypertension: Secondary | ICD-10-CM

## 2011-11-09 DIAGNOSIS — E119 Type 2 diabetes mellitus without complications: Secondary | ICD-10-CM

## 2011-11-09 LAB — BASIC METABOLIC PANEL
BUN: 15 mg/dL (ref 6–23)
CO2: 26 mEq/L (ref 19–32)
Calcium: 9.4 mg/dL (ref 8.4–10.5)
GFR: 87.22 mL/min (ref >60.00)
Glucose, Bld: 122 mg/dL — ABNORMAL HIGH (ref 70–99)

## 2011-11-09 LAB — LDL CHOLESTEROL, DIRECT: Direct LDL: 120.4 mg/dL

## 2011-11-09 LAB — LIPID PANEL
HDL: 52.9 mg/dL (ref >39.00)
VLDL: 28.8 mg/dL (ref 0.0–40.0)

## 2011-11-09 LAB — HEMOGLOBIN A1C: Hgb A1c MFr Bld: 7.1 % — ABNORMAL HIGH (ref 4.6–6.5)

## 2011-11-09 LAB — TSH: TSH: 1.49 u[IU]/mL (ref 0.35–5.50)

## 2011-11-09 NOTE — Assessment & Plan Note (Signed)
See instructions

## 2011-11-09 NOTE — Assessment & Plan Note (Addendum)
Td 10-10 Had a flu shot few months ago FH colon Ca - M, dx in her 59s ---->  iFOB  provided, discussed the option of colonoscopy. Was referred for a stres test last year but eventually did not go, asx  Diet-exercise discussed  Labs

## 2011-11-09 NOTE — Assessment & Plan Note (Signed)
Sees eye doctor regularly Feet care discussed

## 2011-11-09 NOTE — Progress Notes (Signed)
  Subjective:    Patient ID: Charles Yoder, male    DOB: 1970-12-30, 41 y.o.   MRN: [id number]  HPI CPX  Past Medical History: Diabetes mellitus, type II--Dx aprox 2004 Hypertension  Past Surgical History: left elbow Fx as a child--surgery  Family History: CAD - GM (in her 10s) HTN - M DM - M, F stroke - aunt colon Ca - M, dx in her 27s  prostate Ca - no  Social History: Married, 2 kids  Never Smoked Alcohol use-no Drug use-no Occupation: Sports administrator Regular exercise--- very active @ works,lift wt sometimes   diet-- tries to follow a diabetic diet , reports he does ok   Review of Systems  Constitutional: Negative for fatigue and unexpected weight change.  Respiratory: Negative for cough and shortness of breath.   Gastrointestinal: Negative for abdominal pain and blood in stool.  Genitourinary: Negative for dysuria, hematuria and difficulty urinating.  Psychiatric/Behavioral:       No depression or anxiety        Objective:   Physical Exam General:  alert, well-developed, and overweight-appearing.   Neck:  no masses and no thyromegaly.   Lungs:  normal respiratory effort, no intercostal retractions, no accessory muscle use, and normal breath sounds.   Heart:  normal rate, regular rhythm, no murmur, and no gallop.   Abdomen:  soft, non-tender, no distention, and no masses.  no hepatomegaly and no splenomegaly.   Extremities:  no pretibial edema bilaterally  Psych:  Oriented X3, memory intact for recent and remote, normally interactive, good eye contact, not anxious appearing, and not depressed appearing.       Assessment & Plan:

## 2011-11-09 NOTE — Patient Instructions (Signed)
Check the  blood pressure 2 or 3 times a week, be sure it is less than 140/85. If it is consistently higher, let me know

## 2011-11-11 ENCOUNTER — Encounter: Payer: Self-pay | Admitting: Internal Medicine

## 2011-11-15 ENCOUNTER — Telehealth: Payer: Self-pay | Admitting: *Deleted

## 2011-11-15 MED ORDER — ATORVASTATIN CALCIUM 20 MG PO TABS: 20.0000 mg | ORAL_TABLET | Freq: Every day | ORAL | Status: DC

## 2011-11-15 MED ORDER — SITAGLIPTIN PHOS-METFORMIN HCL 50-1000 MG PO TABS: 1.0000 | ORAL_TABLET | Freq: Two times a day (BID) | ORAL | Status: DC

## 2011-11-15 NOTE — Telephone Encounter (Signed)
Refill done.  

## 2011-11-21 ENCOUNTER — Other Ambulatory Visit: Payer: Self-pay | Admitting: Internal Medicine

## 2011-11-21 NOTE — Telephone Encounter (Signed)
Refill done.  

## 2012-03-02 ENCOUNTER — Telehealth: Payer: Self-pay | Admitting: Internal Medicine

## 2012-03-02 NOTE — Telephone Encounter (Signed)
Advise patient: Needs a f/u (has one schedule for 04-2012 but needs f/u sooner)

## 2012-03-04 NOTE — Telephone Encounter (Signed)
lmovm

## 2012-03-10 ENCOUNTER — Other Ambulatory Visit: Payer: Self-pay | Admitting: Internal Medicine

## 2012-03-10 NOTE — Telephone Encounter (Signed)
Refill done.  

## 2012-03-13 ENCOUNTER — Other Ambulatory Visit: Payer: Self-pay | Admitting: Internal Medicine

## 2012-03-14 NOTE — Telephone Encounter (Signed)
Refill done.  

## 2012-03-17 NOTE — Telephone Encounter (Signed)
Lmovm

## 2012-03-19 ENCOUNTER — Encounter: Payer: Self-pay | Admitting: Internal Medicine

## 2012-03-19 NOTE — Telephone Encounter (Signed)
Noted  

## 2012-03-19 NOTE — Telephone Encounter (Signed)
Unable to make contact with patient via phone. Letter mailed 03-19-12.

## 2012-04-10 ENCOUNTER — Other Ambulatory Visit: Payer: Self-pay | Admitting: Internal Medicine

## 2012-04-11 NOTE — Telephone Encounter (Signed)
Refill done.  

## 2012-05-09 ENCOUNTER — Ambulatory Visit: Payer: BC Managed Care – PPO | Admitting: Internal Medicine

## 2012-05-16 ENCOUNTER — Encounter: Payer: Self-pay | Admitting: Internal Medicine

## 2012-05-16 ENCOUNTER — Ambulatory Visit (INDEPENDENT_AMBULATORY_CARE_PROVIDER_SITE_OTHER): Payer: BC Managed Care – PPO | Admitting: Internal Medicine

## 2012-05-16 VITALS — BP 138/86 | HR 93 | Temp 97.9°F | Wt 278.0 lb

## 2012-05-16 DIAGNOSIS — E119 Type 2 diabetes mellitus without complications: Secondary | ICD-10-CM

## 2012-05-16 DIAGNOSIS — E785 Hyperlipidemia, unspecified: Secondary | ICD-10-CM

## 2012-05-16 DIAGNOSIS — I10 Essential (primary) hypertension: Secondary | ICD-10-CM

## 2012-05-16 HISTORY — DX: Hyperlipidemia, unspecified: E78.5

## 2012-05-16 NOTE — Progress Notes (Signed)
  Subjective:    Patient ID: Charles Yoder, male    DOB: 28-Mar-1971, 41 y.o.   MRN: [id number]  HPI ROV Diabetes, since the last time he was here he is working out more, at least 3 times a week at [employer]. Diet has also improved. He is somehow concerned because he has not been able to lose weight. Blood sugars range from 130 to 140. He was unable to start [place] and stopped Actos 6 months ago due to cost Hypertension, good compliance with lisinopril. BPs in ambulatory setting slightly elevated but could not tell me any readings. High cholesterol, started Lipitor a few months ago, good compliance on no apparent tolerance issues.  Past Medical History: Diabetes mellitus, type II--Dx aprox 2004 Hypertension  Past Surgical History: left elbow Fx as a child--surgery  Family History: CAD - GM (in her 15s) HTN - M DM - M, F stroke - aunt colon Ca - M, dx in her 1s   prostate Ca - no  Social History: Married, 2 kids  Never Smoked Alcohol use-no Drug use-no Occupation: Sports administrator  Review of Systems No chest pain or shortness of breath No nausea, vomiting, diarrhea    Objective:   Physical Exam General -- alert, well-developed Lungs -- normal respiratory effort, no intercostal retractions, no accessory muscle use, and normal breath sounds.   Heart-- normal rate, regular rhythm, no murmur, and no gallop.   Extremities-- no pretibial edema bilaterally Psych-- Cognition and judgment appear intact. Alert and cooperative with normal attention span and concentration.  not anxious appearing and not depressed appearing.       Assessment & Plan:

## 2012-05-16 NOTE — Assessment & Plan Note (Signed)
BP reasonably well controlled however he reports that ambulatory BPs are slightly elevated. Plan: Continue with same medications, see instructions

## 2012-05-16 NOTE — Assessment & Plan Note (Signed)
Started Lipitor based on the last cholesterol panel. Will do labs. See instructions

## 2012-05-16 NOTE — Assessment & Plan Note (Addendum)
Unable to afford to Actos for the last 6 months. Base on the last A1c was recommended janumet but unable to afford, still takes metformin as prescribed. Working on his diet ("eating lean cusine all the time")and exercise, has been unable to lose weight. Plan: Weight Watchers? Refer to a nutritionist Check A1c Further advice for results. Victoza? (Also, he left some paperwork to be completed with his labs)

## 2012-05-16 NOTE — Patient Instructions (Addendum)
Please come back fasting next week: FLP, AST, ALT --- dx high cholesterol A1c ---- dx diabetes BMP --- dx hypertension --------- Come back in 3 months for a routine checkup

## 2012-05-23 ENCOUNTER — Other Ambulatory Visit (INDEPENDENT_AMBULATORY_CARE_PROVIDER_SITE_OTHER): Payer: BC Managed Care – PPO

## 2012-05-23 DIAGNOSIS — I10 Essential (primary) hypertension: Secondary | ICD-10-CM

## 2012-05-23 DIAGNOSIS — E7800 Pure hypercholesterolemia, unspecified: Secondary | ICD-10-CM

## 2012-05-23 DIAGNOSIS — E119 Type 2 diabetes mellitus without complications: Secondary | ICD-10-CM

## 2012-05-23 LAB — LIPID PANEL
HDL: 42.7 mg/dL (ref >39.00)
LDL Cholesterol: 49 mg/dL (ref 0–99)
Total CHOL/HDL Ratio: 3
Triglycerides: 121 mg/dL (ref 0.0–149.0)
VLDL: 24.2 mg/dL (ref 0.0–40.0)

## 2012-05-29 ENCOUNTER — Other Ambulatory Visit: Payer: Self-pay | Admitting: Internal Medicine

## 2012-05-29 NOTE — Telephone Encounter (Signed)
Is the pt still taking this med or has it been discontinued? Please advise.

## 2012-05-29 NOTE — Telephone Encounter (Signed)
Not on Actos anymore but he does need to come back for a A1c and BMP

## 2012-05-30 ENCOUNTER — Other Ambulatory Visit: Payer: Self-pay | Admitting: Internal Medicine

## 2012-05-30 ENCOUNTER — Other Ambulatory Visit: Payer: BC Managed Care – PPO

## 2012-05-30 NOTE — Telephone Encounter (Signed)
Per dr. Larose Kells the pt is no longer taking this med. Refill denied.

## 2012-05-30 NOTE — Telephone Encounter (Signed)
rx denied

## 2012-06-03 ENCOUNTER — Other Ambulatory Visit: Payer: Self-pay | Admitting: Internal Medicine

## 2012-06-03 ENCOUNTER — Other Ambulatory Visit (INDEPENDENT_AMBULATORY_CARE_PROVIDER_SITE_OTHER): Payer: BC Managed Care – PPO

## 2012-06-03 DIAGNOSIS — I10 Essential (primary) hypertension: Secondary | ICD-10-CM

## 2012-06-03 LAB — BASIC METABOLIC PANEL
BUN: 14 mg/dL (ref 6–23)
CO2: 25 mEq/L (ref 19–32)
Calcium: 9.4 mg/dL (ref 8.4–10.5)
Chloride: 101 mEq/L (ref 96–112)
Creatinine, Ser: 1.2 mg/dL (ref 0.4–1.5)
Glucose, Bld: 122 mg/dL — ABNORMAL HIGH (ref 70–99)

## 2012-06-03 LAB — HEMOGLOBIN A1C: Hgb A1c MFr Bld: 7.2 % — ABNORMAL HIGH (ref 4.6–6.5)

## 2012-06-03 NOTE — Telephone Encounter (Signed)
Is pt currently taking this med?

## 2012-06-03 NOTE — Progress Notes (Signed)
Labs only

## 2012-06-03 NOTE — Telephone Encounter (Signed)
ACTOS 30 MG TABLET  QTY: 30 1 BY MOUTH ONCE DIALY

## 2012-06-04 NOTE — Telephone Encounter (Signed)
LMOVM for pt to return call 

## 2012-06-04 NOTE — Telephone Encounter (Signed)
1. He was not taking Actos 2. A1c is elevated, cost of medication is an issue, options are victoza, Actos, Januvia. Advise patient: Continue metformin, add Januvia 100 mg, #30 and 4 refills If he is unable to afford Januvia, let me know, we'll try something else. Followup 3 months as recommended

## 2012-06-06 ENCOUNTER — Other Ambulatory Visit: Payer: Self-pay | Admitting: Internal Medicine

## 2012-06-06 ENCOUNTER — Other Ambulatory Visit: Payer: Self-pay | Admitting: Gastroenterology

## 2012-06-06 ENCOUNTER — Telehealth: Payer: Self-pay | Admitting: Internal Medicine

## 2012-06-06 DIAGNOSIS — E119 Type 2 diabetes mellitus without complications: Secondary | ICD-10-CM

## 2012-06-06 MED ORDER — GLIMEPIRIDE 2 MG PO TABS: 2.0000 mg | ORAL_TABLET | Freq: Every day | ORAL | Status: DC

## 2012-06-06 NOTE — Telephone Encounter (Signed)
Spoke to pt. Told him per Dr. Larose Kells Cholesterol is great  Diabetes needs better control. Dr. Larose Kells was going to send inglimepiride 2 mg one tablet every morning, #30, 3 refills  Watch for low sugar symptoms  Office visit in 3 months  Sent medication to pt's pharmacy. Pt stated understanding.

## 2012-06-06 NOTE — Telephone Encounter (Signed)
Per Dr. Larose Kells the pt is no longer taking this medication.

## 2012-06-06 NOTE — Telephone Encounter (Signed)
LMOVM for pt to return call 

## 2012-06-06 NOTE — Telephone Encounter (Signed)
Pt called stating the pharmacy has not received the refill we sent in. Called pharmacy spoke with Larkin Ina & called in glimepiride & test strips.

## 2012-06-06 NOTE — Telephone Encounter (Signed)
Advise patient Cholesterol is great Diabetes needs better control, in the past, I recommended Januvia but cost was an issue. Start glimepiride 2 mg one tablet every morning, #30, 3 refills Watch for low sugar symptoms Office visit in 3 months

## 2012-06-09 ENCOUNTER — Other Ambulatory Visit: Payer: Self-pay | Admitting: Internal Medicine

## 2012-06-09 NOTE — Telephone Encounter (Signed)
Per dr. Larose Kells the pt is no longer taking this med.

## 2012-06-10 ENCOUNTER — Other Ambulatory Visit: Payer: Self-pay

## 2012-06-10 NOTE — Telephone Encounter (Signed)
Pharmacy faxing over Rx for Actos.        MW

## 2012-06-13 NOTE — Telephone Encounter (Signed)
Refill: Actos 30mg  tablet. 1 by mouth once daily. Qty 30.

## 2012-06-13 NOTE — Telephone Encounter (Signed)
The pt is no longer taking this med. The pt is aware & I have told the pharmacy repeatedly.

## 2012-08-29 ENCOUNTER — Encounter: Payer: BC Managed Care – PPO | Admitting: Internal Medicine

## 2012-09-06 ENCOUNTER — Other Ambulatory Visit: Payer: Self-pay | Admitting: Internal Medicine

## 2012-09-08 NOTE — Telephone Encounter (Signed)
Refill done.  

## 2012-10-01 ENCOUNTER — Other Ambulatory Visit: Payer: Self-pay | Admitting: Internal Medicine

## 2012-10-01 NOTE — Telephone Encounter (Signed)
Refill done.  

## 2012-10-06 ENCOUNTER — Other Ambulatory Visit: Payer: Self-pay | Admitting: Internal Medicine

## 2012-10-06 NOTE — Telephone Encounter (Signed)
Refill done.  

## 2012-11-09 ENCOUNTER — Other Ambulatory Visit: Payer: Self-pay | Admitting: Internal Medicine

## 2012-11-10 NOTE — Telephone Encounter (Signed)
Refill done.  

## 2012-11-18 ENCOUNTER — Encounter: Payer: BC Managed Care – PPO | Admitting: Internal Medicine

## 2012-12-11 ENCOUNTER — Other Ambulatory Visit: Payer: Self-pay | Admitting: Internal Medicine

## 2012-12-11 NOTE — Telephone Encounter (Signed)
Refill done.  

## 2012-12-29 ENCOUNTER — Other Ambulatory Visit: Payer: Self-pay | Admitting: Internal Medicine

## 2012-12-30 NOTE — Telephone Encounter (Signed)
Refill done.  

## 2013-01-11 ENCOUNTER — Other Ambulatory Visit: Payer: Self-pay | Admitting: Internal Medicine

## 2013-01-12 NOTE — Telephone Encounter (Signed)
Refill done.  

## 2013-01-16 ENCOUNTER — Encounter: Payer: BC Managed Care – PPO | Admitting: Internal Medicine

## 2013-01-16 DIAGNOSIS — Z0289 Encounter for other administrative examinations: Secondary | ICD-10-CM

## 2013-02-03 ENCOUNTER — Other Ambulatory Visit: Payer: Self-pay | Admitting: Internal Medicine

## 2013-02-04 ENCOUNTER — Encounter: Payer: Self-pay | Admitting: *Deleted

## 2013-02-04 NOTE — Telephone Encounter (Signed)
Refill done.  

## 2013-02-11 ENCOUNTER — Other Ambulatory Visit: Payer: Self-pay | Admitting: Internal Medicine

## 2013-02-12 ENCOUNTER — Other Ambulatory Visit: Payer: Self-pay | Admitting: Internal Medicine

## 2013-02-12 NOTE — Telephone Encounter (Signed)
Refill done.  

## 2013-03-14 ENCOUNTER — Other Ambulatory Visit: Payer: Self-pay | Admitting: Internal Medicine

## 2013-03-16 NOTE — Telephone Encounter (Signed)
Refill done. Pt. Has scheduled OV 03/25/13

## 2013-03-25 ENCOUNTER — Encounter: Payer: Self-pay | Admitting: Internal Medicine

## 2013-03-25 ENCOUNTER — Ambulatory Visit (INDEPENDENT_AMBULATORY_CARE_PROVIDER_SITE_OTHER): Payer: BC Managed Care – PPO | Admitting: Internal Medicine

## 2013-03-25 VITALS — BP 145/90 | HR 93 | Temp 98.1°F | Ht 72.25 in | Wt 274.2 lb

## 2013-03-25 DIAGNOSIS — E785 Hyperlipidemia, unspecified: Secondary | ICD-10-CM

## 2013-03-25 DIAGNOSIS — Z0000 Encounter for general adult medical examination without abnormal findings: Secondary | ICD-10-CM

## 2013-03-25 DIAGNOSIS — E119 Type 2 diabetes mellitus without complications: Secondary | ICD-10-CM

## 2013-03-25 DIAGNOSIS — I10 Essential (primary) hypertension: Secondary | ICD-10-CM

## 2013-03-25 NOTE — Assessment & Plan Note (Addendum)
Td 10-10 FH colon Ca - M, dx in her 91s ---->  iFOB  Provided last year, did not returned; discussed the option of colonoscopy, iFOB provided again Diet-exercise discussed , he is already taking steps to improve his life style, praised Labs

## 2013-03-25 NOTE — Assessment & Plan Note (Signed)
Compliance with medications, feet exam normal today. Last eye exam within a year.Lifestyle improving. Plan: Labs.

## 2013-03-25 NOTE — Assessment & Plan Note (Addendum)
Well-controlled? Strongly recommend to take his BPs at home or at the pharmacy and let me know. See instructions.

## 2013-03-25 NOTE — Assessment & Plan Note (Signed)
Good compliance with meds, labs

## 2013-03-25 NOTE — Progress Notes (Signed)
  Subjective:    Patient ID: Charles Yoder, male    DOB: 1970/11/21, 42 y.o.   MRN: [id number]  HPI  Complete physical exam, last office visit 05-2012. We also discussed the following issues:  Diabetes, reports good medication compliance, blood sugars are checked once a day usually at 2 PM after work, they range from 125 to 150. Denies symptoms consistent with low sugars except that he sweats at night sometimes. No lower extremity paresthesias Hypertension, good medication compliance, not ambulatory BPs. BP today slightly elevated. High cholesterol, good medication compliance.   Past Medical History  Diagnosis Date  . Diabetes mellitus 2004    type 2  . Hypertension    Past Surgical History  Procedure Laterality Date  . Elbow fracture surgery  as a child    left   History   Social History  . Marital Status: Married    Spouse Name: Sheryl    Number of Children: 2  . Years of Education: N/A   Occupational History  . 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 Smoking status: Never Smoker   . Smokeless tobacco: Former Systems developer    Types: Snuff  . Alcohol Use: No  . Drug Use: No  . Sexually Active: Not on file   Other Topics Concern  . Not on file   Social History Narrative                 Family History  Problem Relation Age of Onset  . Colon cancer Mother 49  . Diabetes Mother   . Diabetes Father   . Stroke Other     aunt  . CAD      GM  . Prostate cancer Neg Hx     Review of Systems DIET:Improving, eating less carbohydrates, more fruits vegetables. EXERCISE: Improving, and going to the [facility] 3 times a week No  CP, SOB, lower extremity edema No nausea, vomiting diarrhea No blood in the stools No dysuria, gross hematuria, difficulty urinating   No anxiety, depression       Objective:   Physical Exam BP 145/90  Pulse 93  Temp(Src) 98.1 F (36.7 C) (Oral)  Ht 6' 0.25" (1.835 m)  Wt 274 lb 3.2 oz (124.376 kg)  BMI 36.94 kg/m2  SpO2  97%  General -- alert, well-developed, NAD   Neck --no thyromegaly Lungs -- normal respiratory effort, no intercostal retractions, no accessory muscle use, and normal breath sounds.   Heart-- normal rate, regular rhythm, no murmur, and no gallop.   Abdomen--soft, non-tender, no distention, no masses, no HSM, no guarding, and no rigidity.   DIABETIC FEET EXAM: No lower extremity edema Normal pedal pulses bilaterally Skin and nails are normal without calluses Pinprick examination of the feet normal. Neurologic-- alert & oriented X3 and strength normal in all extremities. Psych-- Cognition and judgment appear intact. Alert and cooperative with normal attention span and concentration.  not anxious appearing and not depressed appearing.       Assessment & Plan:

## 2013-03-25 NOTE — Patient Instructions (Addendum)
Come back fasting: FLP--- dx  hyperlipidemia A1c--- dx  diabetes CMP, CBC-- dx V70 --- Check the  blood pressure 2 or 3 times a week, be sure it is between 110/60 and 140/85. If it is consistently higher or lower, let me know. Call with your BP readings in 3 weeks, if your blood pressure is not well-controlled we need to adjust your medication. --- Next visit 3 months.  Is extremely important to followup with me regularly to be sure your  diabetes is well controlled. Uncontrolled diabetes leads to many health problems ! Marland Kitchen

## 2013-03-27 ENCOUNTER — Other Ambulatory Visit: Payer: BC Managed Care – PPO

## 2013-05-05 ENCOUNTER — Other Ambulatory Visit: Payer: Self-pay | Admitting: Internal Medicine

## 2013-05-05 NOTE — Telephone Encounter (Signed)
Med filled.  

## 2013-05-07 ENCOUNTER — Other Ambulatory Visit: Payer: Self-pay | Admitting: Internal Medicine

## 2013-05-07 NOTE — Telephone Encounter (Signed)
rx refilled per protocol. DJR  

## 2013-06-25 ENCOUNTER — Ambulatory Visit: Payer: BC Managed Care – PPO | Admitting: Internal Medicine

## 2013-06-25 DIAGNOSIS — Z0289 Encounter for other administrative examinations: Secondary | ICD-10-CM

## 2013-07-14 ENCOUNTER — Other Ambulatory Visit: Payer: Self-pay | Admitting: Internal Medicine

## 2013-07-14 NOTE — Telephone Encounter (Signed)
rx refilled per protocol. DJR  

## 2013-07-15 ENCOUNTER — Telehealth: Payer: Self-pay | Admitting: Internal Medicine

## 2013-07-15 DIAGNOSIS — Z0000 Encounter for general adult medical examination without abnormal findings: Secondary | ICD-10-CM

## 2013-07-15 DIAGNOSIS — E119 Type 2 diabetes mellitus without complications: Secondary | ICD-10-CM

## 2013-07-15 DIAGNOSIS — E785 Hyperlipidemia, unspecified: Secondary | ICD-10-CM

## 2013-07-15 NOTE — Telephone Encounter (Signed)
No documentation regarding future lab work  Please advise. DJR

## 2013-07-15 NOTE — Telephone Encounter (Signed)
Patient called and stated that it was time to schedule an apt for his labs. Patient schedule for November 10th @8 :00am. Please advise me with lab orders. thanks

## 2013-07-16 NOTE — Telephone Encounter (Signed)
Labs ordered. DJR

## 2013-07-16 NOTE — Telephone Encounter (Signed)
FLP--- dx hyperlipidemia  A1c--- dx diabetes  CMP, CBC-- dx V70

## 2013-07-16 NOTE — Addendum Note (Signed)
Addended by: Peggyann Shoals on: 07/16/2013 10:18 AM   Modules accepted: Orders

## 2013-07-20 ENCOUNTER — Other Ambulatory Visit (INDEPENDENT_AMBULATORY_CARE_PROVIDER_SITE_OTHER): Payer: BC Managed Care – PPO

## 2013-07-20 DIAGNOSIS — Z0000 Encounter for general adult medical examination without abnormal findings: Secondary | ICD-10-CM

## 2013-07-20 DIAGNOSIS — E119 Type 2 diabetes mellitus without complications: Secondary | ICD-10-CM

## 2013-07-20 DIAGNOSIS — E785 Hyperlipidemia, unspecified: Secondary | ICD-10-CM

## 2013-07-20 LAB — COMPREHENSIVE METABOLIC PANEL WITH GFR
ALT: 23 U/L (ref 0–53)
AST: 19 U/L (ref 0–37)
Albumin: 4.2 g/dL (ref 3.5–5.2)
Alkaline Phosphatase: 70 U/L (ref 39–117)
BUN: 15 mg/dL (ref 6–23)
CO2: 29 meq/L (ref 19–32)
Calcium: 9.5 mg/dL (ref 8.4–10.5)
Chloride: 102 meq/L (ref 96–112)
Creatinine, Ser: 1.3 mg/dL (ref 0.4–1.5)
GFR: 78.06 mL/min
Glucose, Bld: 165 mg/dL — ABNORMAL HIGH (ref 70–99)
Potassium: 4 meq/L (ref 3.5–5.1)
Sodium: 137 meq/L (ref 135–145)
Total Bilirubin: 0.9 mg/dL (ref 0.3–1.2)
Total Protein: 6.9 g/dL (ref 6.0–8.3)

## 2013-07-20 LAB — CBC
Hemoglobin: 15.6 g/dL (ref 13.0–17.0)
MCHC: 33.8 g/dL (ref 30.0–36.0)
Platelets: 186 10*3/uL (ref 150.0–400.0)
RDW: 12.4 % (ref 11.5–14.6)

## 2013-07-20 LAB — HEPATIC FUNCTION PANEL
Alkaline Phosphatase: 70 U/L (ref 39–117)
Total Bilirubin: 0.9 mg/dL (ref 0.3–1.2)

## 2013-07-20 LAB — HEMOGLOBIN A1C: Hgb A1c MFr Bld: 7.7 % — ABNORMAL HIGH (ref 4.6–6.5)

## 2013-07-24 ENCOUNTER — Ambulatory Visit: Payer: BC Managed Care – PPO

## 2013-07-24 DIAGNOSIS — Z0000 Encounter for general adult medical examination without abnormal findings: Secondary | ICD-10-CM

## 2013-07-26 ENCOUNTER — Telehealth: Payer: Self-pay | Admitting: Internal Medicine

## 2013-07-26 NOTE — Telephone Encounter (Signed)
(  FLP not done) Advise pt: Diabetes needs better control. Recommend to increase Amaryl 2 mg from one tablet daily to 3 tabs a day, Watch for sugars, if CBGs getting  < 90, decrease to 2 tablets a day. Office visit in 2 months, fasting.

## 2013-07-27 ENCOUNTER — Ambulatory Visit (INDEPENDENT_AMBULATORY_CARE_PROVIDER_SITE_OTHER): Payer: BC Managed Care – PPO | Admitting: Internal Medicine

## 2013-07-27 ENCOUNTER — Encounter: Payer: Self-pay | Admitting: Internal Medicine

## 2013-07-27 VITALS — BP 138/91 | HR 87 | Temp 98.5°F | Wt 280.0 lb

## 2013-07-27 DIAGNOSIS — I10 Essential (primary) hypertension: Secondary | ICD-10-CM

## 2013-07-27 DIAGNOSIS — E119 Type 2 diabetes mellitus without complications: Secondary | ICD-10-CM

## 2013-07-27 DIAGNOSIS — E785 Hyperlipidemia, unspecified: Secondary | ICD-10-CM

## 2013-07-27 LAB — LIPID PANEL
Cholesterol: 125 mg/dL (ref 0–200)
HDL: 43.6 mg/dL (ref >39.00)
VLDL: 26.6 mg/dL (ref 0.0–40.0)

## 2013-07-27 MED ORDER — GLIMEPIRIDE 2 MG PO TABS: ORAL_TABLET | ORAL | Status: DC

## 2013-07-27 MED ORDER — ATORVASTATIN CALCIUM 20 MG PO TABS: ORAL_TABLET | ORAL | Status: DC

## 2013-07-27 MED ORDER — SITAGLIPTIN PHOS-METFORMIN HCL 50-1000 MG PO TABS: 1.0000 | ORAL_TABLET | Freq: Two times a day (BID) | ORAL | Status: DC

## 2013-07-27 MED ORDER — LOSARTAN POTASSIUM-HCTZ 100-25 MG PO TABS: 1.0000 | ORAL_TABLET | Freq: Every day | ORAL | Status: DC

## 2013-07-27 NOTE — Assessment & Plan Note (Addendum)
Ambulatory BP 150/100 despite good medication compliance. Discontinue lisinopril 40 Start losartan HCT 100-25, BMP in 2 weeks, see instructions

## 2013-07-27 NOTE — Assessment & Plan Note (Addendum)
Last A1c7.7 ; despite patient's perception of improved diet, he has gained weight. Plan:  Continue with glimeperide 2 mg daily D/c metformin, start janumet 50-1000 bid Samples and a Rx provided  encouraged self learning

## 2013-07-27 NOTE — Progress Notes (Signed)
  Subjective:    Patient ID: Charles Yoder, male    DOB: 03/29/1971, 42 y.o.   MRN: [id number]  HPI ROV diabetes, good medication compliance,  CBGs at around 130. See assessment and plan Hypertension--good medication compliance, ambulatory BPs 150/100 on average. High cholesterol, good medication compliance. all recent labs reviewed with the patient, see assessment and plan   Past Medical History  Diagnosis Date  . Diabetes mellitus 2004    type 2  . Hypertension    Past Surgical History  Procedure Laterality Date  . Elbow fracture surgery  as a child    left   History   Social History  . Marital Status: Married    Spouse Name: Sheryl    Number of Children: 2  . Years of Education: N/A   Occupational History  . 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 Smoking status: Never Smoker   . Smokeless tobacco: Former Systems developer    Types: Snuff  . Alcohol Use: No  . Drug Use: No  . Sexual Activity: Not on file   Other Topics Concern  . Not on file   Social History Narrative                  Review of Systems Diet, Exercise-- improved per pt but has gain wt No  CP, SOB, lower extremity edema  Denies  nausea, vomiting diarrhea Denies  blood in the stools       Objective:   Physical Exam BP 138/91  Pulse 87  Temp(Src) 98.5 F (36.9 C)  Wt 280 lb (127.007 kg)  SpO2 98% General -- alert, well-developed, NAD.   Lungs -- normal respiratory effort, no intercostal retractions, no accessory muscle use, and normal breath sounds.  Heart-- normal rate, regular rhythm, no murmur.   Extremities-- no pretibial edema bilaterally  Neurologic--  alert & oriented X3. Speech normal, gait normal, strength normal in all extremities.  Psych-- Cognition and judgment appear intact. Cooperative with normal attention span and concentration. No anxious appearing , no depressed appearing.      Assessment & Plan:

## 2013-07-27 NOTE — Patient Instructions (Addendum)
Come back in 2 weeks for labs only : BMP dx HTN   Next visit in 3 months  for a   follow up: diabetes, hypertension (30 minutes) No Fasting Please make an appointment    Stop metformin Start janumet twice a day  Stop lisinopril Start losartan  Check the  blood pressure 2 or 3 times a  week be sure it is between 110/60 and 140/85. Ideal blood pressure is 120/80. If it is consistently higher or lower, let me know

## 2013-07-27 NOTE — Assessment & Plan Note (Addendum)
Last FLP great, continue Lipitor

## 2013-07-28 NOTE — Telephone Encounter (Signed)
pt to continue amaryl 2mg  one tablet daily, per dr Larose Kells request to disregard message to increase amaryl. DJR

## 2013-08-03 ENCOUNTER — Encounter: Payer: Self-pay | Admitting: *Deleted

## 2013-08-15 ENCOUNTER — Telehealth: Payer: Self-pay | Admitting: Internal Medicine

## 2013-08-15 NOTE — Telephone Encounter (Signed)
Needs BMP, dx HTN, please arrenge

## 2013-08-18 ENCOUNTER — Other Ambulatory Visit: Payer: Self-pay | Admitting: *Deleted

## 2013-08-18 DIAGNOSIS — I10 Essential (primary) hypertension: Secondary | ICD-10-CM

## 2013-08-18 NOTE — Telephone Encounter (Signed)
Called and left message for patient to please call back and schedule lab appt for BMP. Order is in [place].

## 2013-08-26 ENCOUNTER — Telehealth: Payer: Self-pay | Admitting: *Deleted

## 2013-08-26 NOTE — Telephone Encounter (Signed)
Called and left detailed message for patient to please call back and schedule a lab appt for BMP. Order put into epic. JG//CMA

## 2013-09-07 ENCOUNTER — Other Ambulatory Visit (INDEPENDENT_AMBULATORY_CARE_PROVIDER_SITE_OTHER): Payer: BC Managed Care – PPO

## 2013-09-07 DIAGNOSIS — I10 Essential (primary) hypertension: Secondary | ICD-10-CM

## 2013-09-07 LAB — BASIC METABOLIC PANEL
CO2: 22 mEq/L (ref 19–32)
Calcium: 9.6 mg/dL (ref 8.4–10.5)
Chloride: 105 mEq/L (ref 96–112)
Creatinine, Ser: 1.3 mg/dL (ref 0.4–1.5)
Potassium: 3.6 mEq/L (ref 3.5–5.1)
Sodium: 139 mEq/L (ref 135–145)

## 2013-09-14 ENCOUNTER — Encounter: Payer: Self-pay | Admitting: *Deleted

## 2013-10-01 ENCOUNTER — Other Ambulatory Visit: Payer: Self-pay | Admitting: Internal Medicine

## 2013-11-26 ENCOUNTER — Other Ambulatory Visit: Payer: Self-pay | Admitting: Internal Medicine

## 2013-12-30 ENCOUNTER — Telehealth: Payer: Self-pay | Admitting: Internal Medicine

## 2013-12-30 ENCOUNTER — Other Ambulatory Visit: Payer: Self-pay | Admitting: Internal Medicine

## 2013-12-30 NOTE — Telephone Encounter (Signed)
Patient called and stated that Janumet is too high. Patient would like to switch back to metformin. Please advise.

## 2013-12-30 NOTE — Telephone Encounter (Signed)
Schedule office visit asap , he is overdue. Continue with janumet until he sees me.  Provide discount card if available.

## 2013-12-31 NOTE — Telephone Encounter (Signed)
Patient called the office requesting change of medication. Coupon card provided but pt advised that he is overdue for office visit, must have appt.

## 2014-01-06 ENCOUNTER — Ambulatory Visit: Payer: BC Managed Care – PPO | Admitting: Internal Medicine

## 2014-01-15 ENCOUNTER — Ambulatory Visit: Payer: BC Managed Care – PPO | Admitting: Internal Medicine

## 2014-02-12 ENCOUNTER — Ambulatory Visit: Payer: BC Managed Care – PPO | Admitting: Internal Medicine

## 2014-02-16 ENCOUNTER — Ambulatory Visit: Payer: BC Managed Care – PPO | Admitting: Internal Medicine

## 2014-02-16 DIAGNOSIS — Z0289 Encounter for other administrative examinations: Secondary | ICD-10-CM

## 2014-02-26 ENCOUNTER — Other Ambulatory Visit: Payer: Self-pay | Admitting: Internal Medicine

## 2014-02-26 NOTE — Telephone Encounter (Signed)
Losartan and metformin refilled. OV will be due for next refill. JG/CMA

## 2014-03-04 ENCOUNTER — Other Ambulatory Visit: Payer: Self-pay | Admitting: Internal Medicine

## 2014-05-01 ENCOUNTER — Other Ambulatory Visit: Payer: Self-pay | Admitting: Internal Medicine

## 2014-05-14 ENCOUNTER — Ambulatory Visit: Payer: BC Managed Care – PPO | Admitting: Internal Medicine

## 2014-05-28 ENCOUNTER — Ambulatory Visit: Payer: BC Managed Care – PPO | Admitting: Internal Medicine

## 2014-06-01 ENCOUNTER — Other Ambulatory Visit: Payer: Self-pay

## 2014-06-01 MED ORDER — SITAGLIPTIN PHOS-METFORMIN HCL 50-1000 MG PO TABS: ORAL_TABLET | ORAL | Status: DC

## 2014-07-02 ENCOUNTER — Other Ambulatory Visit: Payer: Self-pay

## 2014-07-31 ENCOUNTER — Other Ambulatory Visit: Payer: Self-pay | Admitting: Internal Medicine

## 2014-08-24 ENCOUNTER — Ambulatory Visit (INDEPENDENT_AMBULATORY_CARE_PROVIDER_SITE_OTHER): Payer: BC Managed Care – PPO | Admitting: Internal Medicine

## 2014-08-24 ENCOUNTER — Encounter: Payer: Self-pay | Admitting: Internal Medicine

## 2014-08-24 VITALS — BP 133/93 | HR 81 | Temp 97.9°F | Ht 72.0 in | Wt 281.2 lb

## 2014-08-24 DIAGNOSIS — E785 Hyperlipidemia, unspecified: Secondary | ICD-10-CM

## 2014-08-24 DIAGNOSIS — I10 Essential (primary) hypertension: Secondary | ICD-10-CM

## 2014-08-24 DIAGNOSIS — E119 Type 2 diabetes mellitus without complications: Secondary | ICD-10-CM

## 2014-08-24 NOTE — Patient Instructions (Signed)
Stop by the front desk and schedule labs to be done within few days (fasting)   Please come back to the office in 3-4 months  for a physical exam. Come back fasting     Check the  blood pressure 2 or 3 times a  Week  Be sure your blood pressure is between  145/85  and 110/65.  if it is consistently higher or lower, let me know

## 2014-08-24 NOTE — Progress Notes (Signed)
Subjective:    Patient ID: Charles Yoder, male    DOB: 06-16-1971, 43 y.o.   MRN: [id number]  DOS:  08/24/2014 Type of visit - description : ROV. Last office visit about a year ago Interval history: Hypertension, reports good compliance with medications, ambulatory BP consistently in the 120, 130 range. Diastolic BP? Diabetes, good compliance of medications without apparent side effects. Ambulatory blood sugars range from 150-200. High cholesterol, on Lipitor, denies apparent side effects.  ROS Denies chest pain, lower extremity edema. No difficulty breathing. No  nausea, vomiting, diarrhea. No lower extremity paresthesias, reports he saw the eye doctor this year, good reports. Had a flu shot  Past Medical History  Diagnosis Date  . Diabetes mellitus 2004    type 2  . Hypertension     Past Surgical History  Procedure Laterality Date  . Elbow fracture surgery  as a child    left    History   Social History  . Marital Status: Married    Spouse Name: Sheryl    Number of Children: 2  . Years of Education: N/A   Occupational History  . 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 Smoking status: Never Smoker   . Smokeless tobacco: Former Systems developer    Types: Snuff  . Alcohol Use: No  . Drug Use: No  . Sexual Activity: Not on file   Other Topics Concern  . Not on file   Social History Narrative                      Medication List       This list is accurate as of: 08/24/14 11:59 PM.  Always use your most recent med list.               aspirin 81 MG tablet  Take 81 mg by mouth daily.     atorvastatin 20 MG tablet  Commonly known as:  LIPITOR  TAKE 1 TABLET (20 MG TOTAL) BY MOUTH DAILY.     BAYER MICROLET LANCETS lancets  by Other route 2 (two) times daily. Use as instructed     glimepiride 2 MG tablet  Commonly known as:  AMARYL  TAKE 1 TABLET EVERY MORNING     glucose blood test strip  1 each by Other route as needed.  Bayer Contour test strip-Use as instructed     losartan-hydrochlorothiazide 100-25 MG per tablet  Commonly known as:  HYZAAR  TAKE 1 TABLET BY MOUTH DAILY.     sitaGLIPtin-metformin 50-1000 MG per tablet  Commonly known as:  JANUMET  TAKE 1 TABLET BY MOUTH 2 (TWO) TIMES DAILY WITH A MEAL.           Objective:   Physical Exam BP 133/93 mmHg  Pulse 81  Temp(Src) 97.9 F (36.6 C) (Oral)  Ht 6' (1.829 m)  Wt 281 lb 4 oz (127.574 kg)  BMI 38.14 kg/m2  SpO2 96% BP recheck in both arms ----> 12590 General -- alert, well-developed, NAD.  Neck --no thyromegaly  HEENT-- Not pale.   Lungs -- normal respiratory effort, no intercostal retractions, no accessory muscle use, and normal breath sounds.  Heart-- normal rate, regular rhythm, no murmur.  Extremities-- no pretibial edema bilaterally  Neurologic--  alert & oriented X3. Speech normal, gait appropriate for age, strength symmetric and appropriate for age.  Psych-- Cognition and judgment appear intact. Cooperative with normal attention span and concentration. No anxious or  depressed appearing.        Assessment & Plan:

## 2014-08-24 NOTE — Progress Notes (Signed)
Pre visit review using our clinic review tool, if applicable. No additional management support is needed unless otherwise documented below in the visit note. 

## 2014-08-25 NOTE — Assessment & Plan Note (Signed)
BP at home is reportedly 120, 130. Here there is systolic BP is normal but the diastolic BP has been consistently 90. Room for improvement, continue with losartan HCT, continue monitoring BPs at home, work on diet-exercise

## 2014-08-25 NOTE — Assessment & Plan Note (Signed)
continue with Lipitor, check a cholesterol panel. Has a form  for his insurance, will be filling it once we had the blood work.

## 2014-08-25 NOTE — Assessment & Plan Note (Signed)
Continue with Janumet and glimepiride. Check a A1c. Reports he had an eye check this year.

## 2014-08-30 ENCOUNTER — Other Ambulatory Visit (INDEPENDENT_AMBULATORY_CARE_PROVIDER_SITE_OTHER): Payer: BC Managed Care – PPO

## 2014-08-30 DIAGNOSIS — I10 Essential (primary) hypertension: Secondary | ICD-10-CM

## 2014-08-30 DIAGNOSIS — E119 Type 2 diabetes mellitus without complications: Secondary | ICD-10-CM

## 2014-08-30 DIAGNOSIS — E785 Hyperlipidemia, unspecified: Secondary | ICD-10-CM

## 2014-08-30 LAB — COMPREHENSIVE METABOLIC PANEL
ALT: 22 U/L (ref 0–53)
AST: 19 U/L (ref 0–37)
Albumin: 3.9 g/dL (ref 3.5–5.2)
Alkaline Phosphatase: 78 U/L (ref 39–117)
BILIRUBIN TOTAL: 0.6 mg/dL (ref 0.2–1.2)
BUN: 14 mg/dL (ref 6–23)
CO2: 27 mEq/L (ref 19–32)
CREATININE: 1.2 mg/dL (ref 0.4–1.5)
Calcium: 9.2 mg/dL (ref 8.4–10.5)
Chloride: 104 mEq/L (ref 96–112)
GFR: 82.05 mL/min (ref >60.00)
Glucose, Bld: 148 mg/dL — ABNORMAL HIGH (ref 70–99)
Potassium: 3.8 mEq/L (ref 3.5–5.1)
Sodium: 138 mEq/L (ref 135–145)
Total Protein: 6.7 g/dL (ref 6.0–8.3)

## 2014-08-30 LAB — LIPID PANEL
CHOL/HDL RATIO: 3
Cholesterol: 115 mg/dL (ref 0–200)
HDL: 35.4 mg/dL — AB (ref >39.00)
LDL Cholesterol: 58 mg/dL (ref 0–99)
NonHDL: 79.6
TRIGLYCERIDES: 108 mg/dL (ref 0.0–149.0)
VLDL: 21.6 mg/dL (ref 0.0–40.0)

## 2014-08-30 LAB — HEMOGLOBIN A1C: Hgb A1c MFr Bld: 7.9 % — ABNORMAL HIGH (ref 4.6–6.5)

## 2014-08-31 LAB — TSH: TSH: 2.77 u[IU]/mL (ref 0.35–4.50)

## 2014-09-01 ENCOUNTER — Telehealth: Payer: Self-pay | Admitting: Internal Medicine

## 2014-09-01 MED ORDER — CANAGLIFLOZIN 100 MG PO TABS: 100.0000 mg | ORAL_TABLET | Freq: Every day | ORAL | Status: DC

## 2014-09-01 MED ORDER — GLIMEPIRIDE 4 MG PO TABS: 4.0000 mg | ORAL_TABLET | Freq: Every day | ORAL | Status: DC

## 2014-09-01 NOTE — Telephone Encounter (Signed)
Caller name:Minier Yoshiharu Relation to [id number] Call back [phone] Pharmacy:  Reason for call: pt is returning your call please call back

## 2014-09-01 NOTE — Addendum Note (Signed)
Addended by: Wilfrid Lund on: 09/01/2014 04:05 PM   Modules accepted: Orders, Medications

## 2014-09-01 NOTE — Telephone Encounter (Signed)
LMOM informing Pt of lab results (see lab result notes).

## 2014-09-02 ENCOUNTER — Telehealth: Payer: Self-pay

## 2014-09-02 NOTE — Telephone Encounter (Signed)
Called Pt to inform him his health maintenance form is ready for pick up at front desk. Informed him unable to fax due to no fax number given and his portion was not signed.

## 2014-10-31 ENCOUNTER — Other Ambulatory Visit: Payer: Self-pay | Admitting: Internal Medicine

## 2014-11-26 ENCOUNTER — Other Ambulatory Visit: Payer: Self-pay | Admitting: Internal Medicine

## 2014-12-06 ENCOUNTER — Telehealth: Payer: Self-pay | Admitting: Internal Medicine

## 2014-12-06 NOTE — Telephone Encounter (Signed)
Pre visit letter sent  °

## 2014-12-20 ENCOUNTER — Other Ambulatory Visit: Payer: Self-pay

## 2014-12-23 ENCOUNTER — Other Ambulatory Visit: Payer: Self-pay

## 2014-12-23 ENCOUNTER — Telehealth: Payer: Self-pay

## 2014-12-23 NOTE — Telephone Encounter (Signed)
Attempted pre visit call, LMTCB.

## 2014-12-24 ENCOUNTER — Encounter: Payer: BC Managed Care – PPO | Admitting: Internal Medicine

## 2015-01-05 ENCOUNTER — Other Ambulatory Visit: Payer: Self-pay | Admitting: Internal Medicine

## 2015-01-06 ENCOUNTER — Telehealth: Payer: Self-pay | Admitting: Internal Medicine

## 2015-01-06 NOTE — Telephone Encounter (Signed)
Caller: Herma Mering Relation to Pt: self Phone # [phone] Pharmacy: [facility] on [address], [place]  Reason for call:   JANUMET 50-1000 MG per tablet     Pt said he had a card to get 1/2 price discount on his medication and it has expired. He wants to know if you have any more discount cards or if medication can be changed to something less expensive.

## 2015-01-06 NOTE — Telephone Encounter (Signed)
Spoke with Pt, informed him that I do have another prescription discount card, informed him I will place at front desk for pick up at his earliest convenience. Pt verbalized understanding.

## 2015-01-09 ENCOUNTER — Ambulatory Visit (INDEPENDENT_AMBULATORY_CARE_PROVIDER_SITE_OTHER): Payer: BLUE CROSS/BLUE SHIELD | Admitting: Family Medicine

## 2015-01-09 ENCOUNTER — Other Ambulatory Visit: Payer: Self-pay | Admitting: Family Medicine

## 2015-01-09 VITALS — BP 116/84 | HR 106 | Temp 97.9°F | Resp 16 | Ht 72.5 in | Wt 277.1 lb

## 2015-01-09 DIAGNOSIS — L918 Other hypertrophic disorders of the skin: Secondary | ICD-10-CM | POA: Diagnosis not present

## 2015-01-09 NOTE — Progress Notes (Signed)
1 cc 1% lido with epi injected at base of growth. Skin tag pulled up with forceps, removed with scissors. Minimal bleeding, controlled with pressure and band-aid. Removed lesion placed in container for culture.

## 2015-01-09 NOTE — Patient Instructions (Signed)
Keep wound clean/covered until healed. vaseline or polysporin over wound as needed. Return to the clinic or go to the nearest emergency room if any of your symptoms worsen or new symptoms occur.

## 2015-01-09 NOTE — Progress Notes (Signed)
Subjective:    Patient ID: Charles Yoder, male    DOB: [birth year], 44 y.o.   MRN: [id number]  This chart was scribed for Merri Ray, MD by Stephania Fragmin, ED Scribe. This patient was seen in room 9 and the patient's care was started at 3:33 PM.   HPI  Chief Complaint  Patient presents with  . Skin Tag Removal    Back of Neck    HPI Comments: Charles Yoder is a 44 y.o. male who presents to the [facility] for a removal of a skin tag on his neck that has been present for 4-5 years. Patient states it irritates him because it itches and he will scratch it, occasionally causing minimal bleeding. He denies any changes to it. Patient is otherwise well. Patient reports his DM is stable, and his blood glucose usually hovers between 120-130. He denies bleeding easily. He has no known allergies to Betadine or Iodine.    Patient Active Problem List   Diagnosis Date Noted  . Hyperlipidemia 05/16/2012  . Annual physical exam 11/09/2011  . DIZZINESS 06/09/2010  . EXOPHTHALMOS 01/05/2009  . DM II (diabetes mellitus, type II), controlled 04/23/2008  . Essential hypertension 04/23/2008   Past Medical History  Diagnosis Date  . Diabetes mellitus 2004    type 2  . Hypertension    Past Surgical History  Procedure Laterality Date  . Elbow fracture surgery  as a child    left   No Known Allergies Prior to Admission medications   Medication Sig Start Date End Date Taking? Authorizing Provider  aspirin 81 MG tablet Take 81 mg by mouth daily.   Yes Historical Provider, MD  atorvastatin (LIPITOR) 20 MG tablet TAKE 1 TABLET (20 MG TOTAL) BY MOUTH DAILY. 11/26/14  Yes Colon Branch, MD  BAYER MICROLET LANCETS lancets by Other route 2 (two) times daily. Use as instructed    Yes Historical Provider, MD  glimepiride (AMARYL) 4 MG tablet Take 1 tablet (4 mg total) by mouth daily with breakfast. 01/05/15  Yes Colon Branch, MD  glucose blood test strip 1 each by Other route as needed. Bayer  Contour test strip-Use as instructed    Yes Historical Provider, MD  JANUMET 50-1000 MG per tablet TAKE 1 TABLET BY MOUTH 2 (TWO) TIMES DAILY WITH A MEAL. 11/01/14  Yes Colon Branch, MD  losartan-hydrochlorothiazide (HYZAAR) 100-25 MG per tablet TAKE 1 TABLET BY MOUTH DAILY. 02/26/14  Yes Colon Branch, MD   History   Social History  . Marital Status: Married    Spouse Name: Secondary school teacher  . Number of Children: 2  . Years of Education: N/A   Occupational History  . 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 Smoking status: Never Smoker   . Smokeless tobacco: Former Systems developer    Types: Snuff  . Alcohol Use: No  . Drug Use: No  . Sexual Activity: Not on file   Other Topics Concern  . Not on file   Social History Narrative                  Review of Systems  Constitutional: Negative for fatigue and unexpected weight change.  Eyes: Negative for visual disturbance.  Respiratory: Negative for cough, chest tightness and shortness of breath.   Cardiovascular: Negative for chest pain, palpitations and leg swelling.  Gastrointestinal: Negative for abdominal pain and blood in stool.  Neurological: Negative for dizziness, light-headedness and  headaches.  Hematological: Does not bruise/bleed easily.       Objective:   Physical Exam  Constitutional: He is oriented to person, place, and time. He appears well-developed and well-nourished. No distress.  HENT:  Head: Normocephalic and atraumatic.  Eyes: Conjunctivae and EOM are normal.  Neck: Neck supple. No tracheal deviation present.  Cardiovascular: Normal rate.   Pulmonary/Chest: Effort normal. No respiratory distress.  Musculoskeletal: Normal range of motion.  Neurological: He is alert and oriented to person, place, and time.  Skin: Skin is warm and dry.  Few, small, approximately 1-2 mm skin tags on the right posterior upper neck. There are 2 small skin tags on the left lateral neck that do not appear irritated.   On  the right lower neck line, upper back, there is a larger skin tag 8 mm tall and 4 mm wide with thickened projectile lesions on the surface, but with flesh colored base and lateral size.   Psychiatric: He has a normal mood and affect. His behavior is normal.  Nursing note and vitals reviewed.   Filed Vitals:   01/09/15 1514  BP: 116/84  Pulse: 106  Temp: 97.9 F (36.6 C)  TempSrc: Oral  Resp: 16  Height: 6' 0.5" (1.842 m)  Weight: 277 lb 2 oz (125.703 kg)  SpO2: 96%        Assessment & Plan:  Jamor Ajayi is a 44 y.o. male Skin tag - Plan: Dermatology pathology  - removed as in procedure note. rtc and wound precautions given.   No orders of the defined types were placed in this encounter.   Patient Instructions  Keep wound clean/covered until healed. vaseline or polysporin over wound as needed. Return to the clinic or go to the nearest emergency room if any of your symptoms worsen or new symptoms occur.      I personally performed the services described in this documentation, which was scribed in my presence. The recorded information has been reviewed and considered, and addended by me as needed.

## 2015-02-26 ENCOUNTER — Other Ambulatory Visit: Payer: Self-pay | Admitting: Internal Medicine

## 2015-04-04 ENCOUNTER — Other Ambulatory Visit: Payer: Self-pay | Admitting: Internal Medicine

## 2015-04-09 ENCOUNTER — Other Ambulatory Visit: Payer: Self-pay | Admitting: Internal Medicine

## 2015-04-20 ENCOUNTER — Other Ambulatory Visit: Payer: Self-pay

## 2015-04-25 ENCOUNTER — Encounter: Payer: Self-pay | Admitting: Internal Medicine

## 2015-04-25 ENCOUNTER — Ambulatory Visit (INDEPENDENT_AMBULATORY_CARE_PROVIDER_SITE_OTHER): Payer: BLUE CROSS/BLUE SHIELD | Admitting: Internal Medicine

## 2015-04-25 VITALS — BP 116/82 | HR 100 | Temp 97.8°F | Ht 73.0 in | Wt 267.5 lb

## 2015-04-25 DIAGNOSIS — Z0000 Encounter for general adult medical examination without abnormal findings: Secondary | ICD-10-CM | POA: Diagnosis not present

## 2015-04-25 DIAGNOSIS — E785 Hyperlipidemia, unspecified: Secondary | ICD-10-CM

## 2015-04-25 DIAGNOSIS — Z23 Encounter for immunization: Secondary | ICD-10-CM | POA: Diagnosis not present

## 2015-04-25 DIAGNOSIS — E119 Type 2 diabetes mellitus without complications: Secondary | ICD-10-CM | POA: Diagnosis not present

## 2015-04-25 DIAGNOSIS — Z800 Family history of malignant neoplasm of digestive organs: Secondary | ICD-10-CM

## 2015-04-25 MED ORDER — GLUCOSE BLOOD VI STRP: ORAL_STRIP | Status: DC

## 2015-04-25 MED ORDER — ONETOUCH DELICA LANCETS FINE MISC: Status: AC

## 2015-04-25 NOTE — Assessment & Plan Note (Addendum)
Td 10-10; PNM 23 today ; prevnar next year Colon cancer screening: FH colon Ca - M, dx in her 23s ---->  ancer screening, previously did not follow-up on iFOBs Plan: GI referral, states he will follow through with a colonoscopy as long as is covered by his insurance Prostate cancer screening: Not indicated at this point, no family history Counseling:Diet-exercise discussed

## 2015-04-25 NOTE — Progress Notes (Signed)
Pre visit review using our clinic review tool, if applicable. No additional management support is needed unless otherwise documented below in the visit note. 

## 2015-04-25 NOTE — Assessment & Plan Note (Addendum)
Based on last A1c, glimepiride dose increased to 4 mg, he was prescribed invokana but was unable to afford it. Since then, he is trying to eat healthier, has lost some weight ( hopefully from diet not from hyperglycemia). Has an  eye check pending for next week Plan: Labs, further advice would result, new glucometer provided. Consider Actos which at some point was very expensive for him but now should be more affordable. he is reluctant to consider injectables

## 2015-04-25 NOTE — Progress Notes (Signed)
Subjective:    Patient ID: Charles Yoder, male    DOB: 1971-08-03, 44 y.o.   MRN: [id number]  DOS:  04/25/2015 Type of visit - description : Complete physical exam Interval history: Good compliance w/ medication, no recent ambulatory BPs. Blood sugars around 117, 130.     Review of Systems Reports weight loss, states he is trying to eat healthier and is going to the gym 2 or 3 times a week. On chart review, he lost 10 pounds recently. Denies polydipsia, polyuria or polyphagia. Constitutional: No fever. No chills. No unexplained wt changes. No unusual sweats  HEENT: No dental problems, no ear discharge, no facial swelling, no voice changes. No eye discharge, no eye  redness , no  intolerance to light   Respiratory: No wheezing , no  difficulty breathing. No cough , no mucus production  Cardiovascular: No CP, no leg swelling , no  Palpitations  GI: no nausea, no vomiting, no diarrhea , no  abdominal pain.  No blood in the stools. No dysphagia, no odynophagia    Endocrine: No polyphagia, no polyuria , no polydipsia  GU: No dysuria, gross hematuria, difficulty urinating. No urinary urgency, no frequency.  Musculoskeletal: No joint swellings or unusual aches or pains  Skin: No change in the color of the skin, palor , no  Rash  Allergic, immunologic: No environmental allergies , no  food allergies  Neurological: No dizziness no  syncope. No headaches. No diplopia, no slurred, no slurred speech, no motor deficits, no facial  Numbness  Hematological: No enlarged lymph nodes, no easy bruising , no unusual bleedings  Psychiatry: No suicidal ideas, no hallucinations, no beavior problems, no confusion.  No unusual/severe anxiety, no depression   Past Medical History  Diagnosis Date  . Diabetes mellitus 2004    type 2  . Hypertension   . Hyperlipidemia 05/16/2012    Past Surgical History  Procedure Laterality Date  . Elbow fracture surgery  as a child    left    Social  History   Social History  . Marital Status: Married    Spouse Name: Secondary school teacher  . Number of Children: 2  . Years of Education: N/A   Occupational History  . 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 Smoking status: Never Smoker   . Smokeless tobacco: Former Systems developer    Types: Snuff  . Alcohol Use: No  . Drug Use: No  . Sexual Activity: Not on file   Other Topics Concern  . Not on file   Social History Narrative    Lives w/ wife and children   2 children 2000, and 2007            Family History  Problem Relation Age of Onset  . Colon cancer Mother 79  . Diabetes Mother   . Diabetes Father   . Stroke Other     aunt  . CAD Other     GM, late onset  . Prostate cancer Neg Hx       Medication List       This list is accurate as of: 04/25/15  4:23 PM.  Always use your most recent med list.               aspirin 81 MG tablet  Take 81 mg by mouth daily.     atorvastatin 20 MG tablet  Commonly known as:  LIPITOR  Take 1 tablet (20 mg total) by mouth  daily.     glimepiride 4 MG tablet  Commonly known as:  AMARYL  Take 1 tablet (4 mg total) by mouth daily with breakfast.     glucose blood test strip  Check blood sugar no more than twice daily.     losartan-hydrochlorothiazide 100-25 MG per tablet  Commonly known as:  HYZAAR  Take 1 tablet by mouth daily.     ONETOUCH DELICA LANCETS FINE Misc  Check blood sugar no more than twice daily.     sitaGLIPtin-metformin 50-1000 MG per tablet  Commonly known as:  JANUMET  Take 1 tablet by mouth 2 (two) times daily with a meal.           Objective:   Physical Exam BP 116/82 mmHg  Pulse 100  Temp(Src) 97.8 F (36.6 C) (Oral)  Ht 6\' 1"  (1.854 m)  Wt 267 lb 8 oz (121.337 kg)  BMI 35.30 kg/m2  SpO2 95% General:   Well developed, well nourished . NAD.  Neck:  No  thyromegaly HEENT:  Normocephalic . Face symmetric, atraumatic Lungs:  CTA B Normal respiratory effort, no intercostal  retractions, no accessory muscle use. Heart: RRR,  no murmur.  No pretibial edema bilaterally  Abdomen:  Not distended, soft, non-tender. No rebound or rigidity. No mass,organomegaly Skin: Exposed areas without rash. Not pale. Not jaundice Neurologic:  alert & oriented X3.  Speech normal, gait appropriate for age and unassisted Strength symmetric and appropriate for age.  Psych: Cognition and judgment appear intact.  Cooperative with normal attention span and concentration.  Behavior appropriate. No anxious or depressed appearing.    Assessment & Plan:

## 2015-04-25 NOTE — Patient Instructions (Signed)
Get your blood work before you leave    

## 2015-04-25 NOTE — Assessment & Plan Note (Signed)
Controlled.  

## 2015-04-26 ENCOUNTER — Telehealth: Payer: Self-pay

## 2015-04-26 LAB — BASIC METABOLIC PANEL
BUN: 14 mg/dL (ref 6–23)
CO2: 29 meq/L (ref 19–32)
Calcium: 10.1 mg/dL (ref 8.4–10.5)
Chloride: 101 mEq/L (ref 96–112)
Creatinine, Ser: 1.34 mg/dL (ref 0.40–1.50)
GFR: 74.11 mL/min (ref >60.00)
GLUCOSE: 147 mg/dL — AB (ref 70–99)
POTASSIUM: 3.5 meq/L (ref 3.5–5.1)
SODIUM: 139 meq/L (ref 135–145)

## 2015-04-26 LAB — CBC WITH DIFFERENTIAL/PLATELET
Basophils Absolute: 0 10*3/uL (ref 0.0–0.1)
Basophils Relative: 0.2 % (ref 0.0–3.0)
EOS PCT: 1.4 % (ref 0.0–5.0)
Eosinophils Absolute: 0.1 10*3/uL (ref 0.0–0.7)
HCT: 46.3 % (ref 39.0–52.0)
Hemoglobin: 15.7 g/dL (ref 13.0–17.0)
LYMPHS ABS: 2.7 10*3/uL (ref 0.7–4.0)
Lymphocytes Relative: 27 % (ref 12.0–46.0)
MCHC: 33.8 g/dL (ref 30.0–36.0)
MCV: 90.5 fl (ref 78.0–100.0)
Monocytes Absolute: 0.7 10*3/uL (ref 0.1–1.0)
Monocytes Relative: 6.7 % (ref 3.0–12.0)
NEUTROS ABS: 6.5 10*3/uL (ref 1.4–7.7)
NEUTROS PCT: 64.7 % (ref 43.0–77.0)
PLATELETS: 190 10*3/uL (ref 150.0–400.0)
RBC: 5.12 Mil/uL (ref 4.22–5.81)
RDW: 12.6 % (ref 11.5–15.5)
WBC: 10.1 10*3/uL (ref 4.0–10.5)

## 2015-04-26 LAB — HEMOGLOBIN A1C: Hgb A1c MFr Bld: 10.3 % — ABNORMAL HIGH (ref 4.6–6.5)

## 2015-04-26 LAB — HIV ANTIBODY (ROUTINE TESTING W REFLEX): HIV: NONREACTIVE

## 2015-04-26 NOTE — Telephone Encounter (Signed)
Received fax confirmation on 04/26/2015 at 1148.

## 2015-04-26 NOTE — Telephone Encounter (Signed)
Received Health Insurance form from Pt at his CPE on 04/25/2015. Form completed, signed by Dr. Larose Kells and faxed to [facility] at [phone]. Copies sent for scanning into Pt chart and original mailed back to Pt as requested.

## 2015-05-03 MED ORDER — GLIMEPIRIDE 4 MG PO TABS: 6.0000 mg | ORAL_TABLET | Freq: Every day | ORAL | Status: DC

## 2015-05-03 MED ORDER — PIOGLITAZONE HCL 30 MG PO TABS: 30.0000 mg | ORAL_TABLET | Freq: Every day | ORAL | Status: DC

## 2015-05-03 NOTE — Addendum Note (Signed)
Addended by: Wilfrid Lund on: 05/03/2015 10:27 AM   Modules accepted: Orders

## 2015-05-27 ENCOUNTER — Encounter: Payer: Self-pay | Admitting: Internal Medicine

## 2015-05-30 ENCOUNTER — Other Ambulatory Visit: Payer: Self-pay | Admitting: Internal Medicine

## 2015-06-09 ENCOUNTER — Other Ambulatory Visit: Payer: Self-pay | Admitting: Internal Medicine

## 2015-06-13 ENCOUNTER — Other Ambulatory Visit: Payer: Self-pay | Admitting: Internal Medicine

## 2015-08-26 ENCOUNTER — Ambulatory Visit: Payer: BLUE CROSS/BLUE SHIELD | Admitting: Internal Medicine

## 2015-08-31 ENCOUNTER — Ambulatory Visit: Payer: BLUE CROSS/BLUE SHIELD | Admitting: Internal Medicine

## 2015-08-31 ENCOUNTER — Telehealth: Payer: Self-pay

## 2015-08-31 NOTE — Telephone Encounter (Signed)
Pt has listed no shows:  01/16/2013 CPE 03/27/2013 Labs 06/25/2013 F/U 02/16/2014 F/U 08/31/2015 F/U  Pt has listed cancellations: 05/14/2014 F/U 05/28/2014 F/U 12/24/2014 CPE 08/26/2015 F/U  Please advise. Would you like to begin dismissal?

## 2015-08-31 NOTE — Telephone Encounter (Signed)
Send a no show fee

## 2015-08-31 NOTE — Telephone Encounter (Signed)
Noted  

## 2015-10-13 ENCOUNTER — Other Ambulatory Visit: Payer: Self-pay | Admitting: Internal Medicine

## 2015-10-21 ENCOUNTER — Encounter: Payer: Self-pay | Admitting: Internal Medicine

## 2015-10-21 ENCOUNTER — Ambulatory Visit (INDEPENDENT_AMBULATORY_CARE_PROVIDER_SITE_OTHER): Payer: BLUE CROSS/BLUE SHIELD | Admitting: Internal Medicine

## 2015-10-21 VITALS — BP 132/78 | HR 95 | Temp 97.8°F | Ht 73.0 in | Wt 275.1 lb

## 2015-10-21 DIAGNOSIS — E119 Type 2 diabetes mellitus without complications: Secondary | ICD-10-CM

## 2015-10-21 DIAGNOSIS — M25562 Pain in left knee: Secondary | ICD-10-CM

## 2015-10-21 DIAGNOSIS — E785 Hyperlipidemia, unspecified: Secondary | ICD-10-CM

## 2015-10-21 DIAGNOSIS — I10 Essential (primary) hypertension: Secondary | ICD-10-CM | POA: Diagnosis not present

## 2015-10-21 NOTE — Progress Notes (Signed)
Pre visit review using our clinic review tool, if applicable. No additional management support is needed unless otherwise documented below in the visit note. 

## 2015-10-21 NOTE — Patient Instructions (Signed)
  GO TO THE FRONT DESK Schedule labs to be done within few days (fasting)  Schedule a complete physical exam to be done in 6 months  Please be fasting

## 2015-10-21 NOTE — Progress Notes (Signed)
Subjective:    Patient ID: Charles Yoder, male    DOB: 1970-12-26, 45 y.o.   MRN: [id number]  DOS:  10/21/2015 Type of visit - description : Routine visit Interval history: DM: Did not start Actos as recommended. CBGs when check elevated, last week they were in the 200s. Could not give me more detailed information HTN: Good compliance of medication, BP today is very good High cholesterol: on Statins, due for labs Developed pain at the left leg about 10 days ago, on and off, located at the posterior thigh and calf. Denies any injury, fall, redness, swelling, paresthesias. He admits to occasional back pain but that is not a new thing for him.    Review of Systems No chest pain, difficulty breathing. No nausea, vomiting, diarrhea  Past Medical History  Diagnosis Date  . Diabetes mellitus 2004    type 2  . Hypertension   . Hyperlipidemia 05/16/2012    Past Surgical History  Procedure Laterality Date  . Elbow fracture surgery  as a child    left    Social History   Social History  . Marital Status: Married    Spouse Name: Secondary school teacher  . Number of Children: 2  . Years of Education: N/A   Occupational History  . 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 Smoking status: Never Smoker   . Smokeless tobacco: Former Systems developer    Types: Snuff  . Alcohol Use: No  . Drug Use: No  . Sexual Activity: Not on file   Other Topics Concern  . Not on file   Social History Narrative    Lives w/ wife and children   2 children 2000, and 2007               Medication List       This list is accurate as of: 10/21/15 10:18 AM.  Always use your most recent med list.               aspirin 81 MG tablet  Take 81 mg by mouth daily.     atorvastatin 20 MG tablet  Commonly known as:  LIPITOR  Take 1 tablet (20 mg total) by mouth daily.     glimepiride 4 MG tablet  Commonly known as:  AMARYL  Take 1.5 tablets (6 mg total) by mouth daily with breakfast.     glucose blood test strip  Check blood sugar no more than twice daily.     losartan-hydrochlorothiazide 100-25 MG tablet  Commonly known as:  HYZAAR  Take 1 tablet by mouth daily.     ONETOUCH DELICA LANCETS FINE Misc  Check blood sugar no more than twice daily.     sitaGLIPtin-metformin 50-1000 MG tablet  Commonly known as:  JANUMET  Take 1 tablet by mouth 2 (two) times daily with a meal.           Objective:   Physical Exam BP 132/78 mmHg  Pulse 95  Temp(Src) 97.8 F (36.6 C) (Oral)  Ht 6\' 1"  (1.854 m)  Wt 275 lb 2 oz (124.796 kg)  BMI 36.31 kg/m2  SpO2 97% General:   Well developed, well nourished . NAD.  HEENT:  Normocephalic . Face symmetric, atraumatic Lungs:  CTA B Normal respiratory effort, no intercostal retractions, no accessory muscle use. Heart: RRR,  no murmur.  No pretibial edema bilaterally  MSK: Back no TTP Skin: No rash at the back or leg Diabetic feet exam:  Normal skin, pulses and  pinprick examination . Neurologic:  alert & oriented X3.  Speech normal, gait appropriate for age and unassisted. DTR, strength symmetric. Straight leg test negative Psych--  Cognition and judgment appear intact.  Cooperative with normal attention span and concentration.  Behavior appropriate. No anxious or depressed appearing.      Assessment & Plan:   Assessment DM 2004 HTN Hyperlipidemia   PLAN 10-21-15 DM: Based on the last A1c I recommended Actos, he did not start, he is not certain why except that "it wasn't at the pharmacy". Diabetes is chronically poorly controlled. States he would be reluctant to take insulin. Will refer to endocrinology. States has an appointment to see the eye doctor this month HTN: Continue Hyzaar, check a BMP High cholesterol:  Continue Lipitor, check FLP Leg pain: MSK sprain ?, radiculopathy? Recommend conservative treatment with Tylenol, avoid Motrin, stretching, call if no better RTC 6 months, CPX

## 2015-10-24 ENCOUNTER — Other Ambulatory Visit: Payer: BLUE CROSS/BLUE SHIELD

## 2015-10-26 ENCOUNTER — Encounter (HOSPITAL_COMMUNITY): Payer: Self-pay

## 2015-10-26 ENCOUNTER — Emergency Department (HOSPITAL_COMMUNITY)
Admission: EM | Admit: 2015-10-26 | Discharge: 2015-10-26 | Disposition: A | Discharge status: Home / Self Care | Payer: BLUE CROSS/BLUE SHIELD | Attending: Emergency Medicine | Admitting: Emergency Medicine

## 2015-10-26 DIAGNOSIS — M5416 Radiculopathy, lumbar region: Secondary | ICD-10-CM | POA: Diagnosis not present

## 2015-10-26 DIAGNOSIS — E785 Hyperlipidemia, unspecified: Secondary | ICD-10-CM | POA: Insufficient documentation

## 2015-10-26 DIAGNOSIS — E119 Type 2 diabetes mellitus without complications: Secondary | ICD-10-CM | POA: Diagnosis not present

## 2015-10-26 DIAGNOSIS — Z7982 Long term (current) use of aspirin: Secondary | ICD-10-CM | POA: Insufficient documentation

## 2015-10-26 DIAGNOSIS — M545 Low back pain: Secondary | ICD-10-CM | POA: Diagnosis present

## 2015-10-26 DIAGNOSIS — Z7984 Long term (current) use of oral hypoglycemic drugs: Secondary | ICD-10-CM | POA: Insufficient documentation

## 2015-10-26 DIAGNOSIS — Z79899 Other long term (current) drug therapy: Secondary | ICD-10-CM | POA: Diagnosis not present

## 2015-10-26 DIAGNOSIS — I10 Essential (primary) hypertension: Secondary | ICD-10-CM | POA: Insufficient documentation

## 2015-10-26 MED ORDER — HYDROCODONE-ACETAMINOPHEN 5-325 MG PO TABS: ORAL_TABLET | ORAL | Status: DC

## 2015-10-26 NOTE — ED Notes (Signed)
PA at bedisde 

## 2015-10-26 NOTE — ED Notes (Addendum)
Patient c/o left leg pain x 2 weeks. Patient states he went to his PCP 8 days ago and was told that he may have a pinched nerve. Patient states that he continues to have pain and is having no relief from Tylenol. Hypertension present during triage. Patient states he takes Lisinopril daily and is compliant.

## 2015-10-26 NOTE — Discharge Instructions (Signed)
For pain control please take ibuprofen (also known as Motrin or Advil) 800mg  (this is normally 4 over the counter pills) 3 times a day  for 5 days. Take with food to minimize stomach irritation.  Take vicodin for breakthrough pain, do not drink alcohol, drive, care for children or do other critical tasks while taking vicodin.  Please follow with your primary care doctor in the next 5 days for high blood pressure evaluation. If you do not have a primary care doctor, present to urgent care. Reduce salt intake. Seek emergency medical care for unilateral weakness, slurring, change in vision, or chest pain and shortness of breath.

## 2015-10-26 NOTE — ED Provider Notes (Signed)
CSN: [id number]     Arrival date & time 10/26/15  1713 History   First MD Initiated Contact with Patient 10/26/15 2002     Chief Complaint  Patient presents with  . Leg Pain  . Hypertension     (Consider location/radiation/quality/duration/timing/severity/associated sxs/prior Treatment) HPI   Blood pressure 168/107, pulse 101, temperature 98.3 F (36.8 C), temperature source Oral, resp. rate 20, height 6\' 1"  (1.854 m), weight 125.193 kg, SpO2 96 %.  Charles Yoder is a 45 y.o. male complaining of mild low back pain onset 1 week ago radiating down the left leg. Pain is intermittent, there is no trauma. He states his pain was severe last night and kept him from sleep, it resolved today towards 3 out of 10, is taking Tylenol at home with some relief. Patient is compliant with his hypertensive medications. Denies fever, chills, change in bowel or bladder habits, h/o IDVU or cancer, numbness or weakness.    Past Medical History  Diagnosis Date  . Diabetes mellitus 2004    type 2  . Hypertension   . Hyperlipidemia 05/16/2012   Past Surgical History  Procedure Laterality Date  . Elbow fracture surgery  as a child    left   Family History  Problem Relation Age of Onset  . Colon cancer Mother 68  . Diabetes Mother   . Diabetes Father   . Stroke Other     aunt  . CAD Other     GM, late onset  . Prostate cancer Neg Hx    Social History  Substance Use Topics  . Smoking status: Never Smoker   . Smokeless tobacco: Former Systems developer    Types: Snuff  . Alcohol Use: No    Review of Systems  10 systems reviewed and found to be negative, except as noted in the HPI.   Allergies  Review of patient's allergies indicates no known allergies.  Home Medications   Prior to Admission medications   Medication Sig Start Date End Date Taking? Authorizing Provider  acetaminophen (TYLENOL) 500 MG tablet Take 500 mg by mouth 2 (two) times daily as needed (pain.).   Yes Historical Provider, MD   aspirin 81 MG tablet Take 81 mg by mouth daily.   Yes Historical Provider, MD  atorvastatin (LIPITOR) 20 MG tablet Take 1 tablet (20 mg total) by mouth daily. 06/09/15  Yes Colon Branch, MD  glimepiride (AMARYL) 4 MG tablet Take 1.5 tablets (6 mg total) by mouth daily with breakfast. 10/13/15  Yes Colon Branch, MD  losartan-hydrochlorothiazide (HYZAAR) 100-25 MG per tablet Take 1 tablet by mouth daily. 05/30/15  Yes Colon Branch, MD  sitaGLIPtin-metformin (JANUMET) 50-1000 MG tablet Take 1 tablet by mouth 2 (two) times daily with a meal. 10/13/15  Yes Colon Branch, MD  glucose blood test strip Check blood sugar no more than twice daily. Patient not taking: Reported on 10/21/2015 04/25/15   Colon Branch, MD  [facility] DELICA LANCETS FINE MISC Check blood sugar no more than twice daily. Patient not taking: Reported on 10/21/2015 04/25/15   Colon Branch, MD   BP 152/113 mmHg  Pulse 103  Temp(Src) 98.3 F (36.8 C) (Oral)  Resp 18  Ht 6\' 1"  (1.854 m)  Wt 125.193 kg  BMI 36.42 kg/m2  SpO2 97% Physical Exam  Constitutional: He appears well-developed and well-nourished.  HENT:  Head: Normocephalic.  Eyes: Conjunctivae are normal.  Neck: Normal range of motion.  Cardiovascular: Normal rate, regular rhythm  and intact distal pulses.   Pulmonary/Chest: Effort normal.  Abdominal: Soft. There is no tenderness.  Neurological: He is alert.  No point tenderness to percussion of lumbar spinal processes.  No TTP or paraspinal muscular spasm. Strength is 5 out of 5 to bilateral lower extremities at hip and knee; extensor hallucis longus 5 out of 5. Ankle strength 5 out of 5, no clonus, neurovascularly intact. No saddle anaesthesia. Patellar reflexes are 2+ bilaterally.    Straight leg raise negative bilaterally Ambulates with a coordinated in nonantalgic gait.   Psychiatric: He has a normal mood and affect.  Nursing note and vitals reviewed.   ED Course  Procedures (including critical care time) Labs Review Labs  Reviewed - No data to display  Imaging Review No results found. I have personally reviewed and evaluated these images and lab results as part of my medical decision-making.   EKG Interpretation None      MDM   Final diagnoses:  Lumbar radiculopathy, acute    Filed Vitals:   10/26/15 1736 10/26/15 2029  BP: 152/113 168/107  Pulse: 103 101  Temp: 98.3 F (36.8 C)   TempSrc: Oral   Resp: 18 20  Height: 6\' 1"  (1.854 m)   Weight: 125.193 kg   SpO2: 97% 96%    Charles Yoder is 45 y.o. male presenting with mild low back pain radiating down left leg. States that his pain was much more significant last night.  back pain.  No neurological deficits and normal neuro exam.  Patient can walk but states is painful.  No loss of bowel or bladder control.  No concern for cauda equina.  No fever, night sweats, weight loss, h/o cancer, IVDU.  RICE protocol and pain medicine indicated and discussed with patient.  Evaluation does not show pathology that would require ongoing emergent intervention or inpatient treatment. Pt is hemodynamically stable and mentating appropriately. Discussed findings and plan with patient/guardian, who agrees with care plan. All questions answered. Return precautions discussed and outpatient follow up given.   Discharge Medication List as of 10/26/2015  8:33 PM    START taking these medications   Details  HYDROcodone-acetaminophen (NORCO/VICODIN) 5-325 MG tablet Take 1-2 tablets by mouth every 6 hours as needed for pain., Print             Monico Blitz, PA-C 10/27/15 0001  Jola Schmidt, MD 10/27/15 [phone]

## 2015-11-10 ENCOUNTER — Other Ambulatory Visit: Payer: Self-pay | Admitting: Internal Medicine

## 2015-12-07 ENCOUNTER — Emergency Department (HOSPITAL_COMMUNITY): Payer: BLUE CROSS/BLUE SHIELD

## 2015-12-07 ENCOUNTER — Emergency Department (HOSPITAL_COMMUNITY)
Admission: EM | Admit: 2015-12-07 | Discharge: 2015-12-07 | Disposition: A | Discharge status: Home / Self Care | Payer: BLUE CROSS/BLUE SHIELD | Attending: Emergency Medicine | Admitting: Emergency Medicine

## 2015-12-07 ENCOUNTER — Encounter (HOSPITAL_COMMUNITY): Payer: Self-pay | Admitting: Emergency Medicine

## 2015-12-07 DIAGNOSIS — R197 Diarrhea, unspecified: Secondary | ICD-10-CM | POA: Diagnosis not present

## 2015-12-07 DIAGNOSIS — Z7982 Long term (current) use of aspirin: Secondary | ICD-10-CM | POA: Diagnosis not present

## 2015-12-07 DIAGNOSIS — R109 Unspecified abdominal pain: Secondary | ICD-10-CM | POA: Insufficient documentation

## 2015-12-07 DIAGNOSIS — E119 Type 2 diabetes mellitus without complications: Secondary | ICD-10-CM | POA: Diagnosis not present

## 2015-12-07 DIAGNOSIS — E785 Hyperlipidemia, unspecified: Secondary | ICD-10-CM | POA: Insufficient documentation

## 2015-12-07 DIAGNOSIS — Z79899 Other long term (current) drug therapy: Secondary | ICD-10-CM | POA: Insufficient documentation

## 2015-12-07 DIAGNOSIS — R112 Nausea with vomiting, unspecified: Secondary | ICD-10-CM | POA: Insufficient documentation

## 2015-12-07 DIAGNOSIS — R000 Tachycardia, unspecified: Secondary | ICD-10-CM | POA: Insufficient documentation

## 2015-12-07 DIAGNOSIS — I10 Essential (primary) hypertension: Secondary | ICD-10-CM | POA: Diagnosis not present

## 2015-12-07 LAB — URINALYSIS, ROUTINE W REFLEX MICROSCOPIC
BILIRUBIN URINE: NEGATIVE
GLUCOSE, UA: 500 mg/dL — AB
KETONES UR: 15 mg/dL — AB
LEUKOCYTES UA: NEGATIVE
NITRITE: NEGATIVE
PROTEIN: 100 mg/dL — AB
Specific Gravity, Urine: 1.025 (ref 1.005–1.030)
pH: 6 (ref 5.0–8.0)

## 2015-12-07 LAB — CBG MONITORING, ED: Glucose-Capillary: 287 mg/dL — ABNORMAL HIGH (ref 65–99)

## 2015-12-07 LAB — BASIC METABOLIC PANEL
ANION GAP: 15 (ref 5–15)
BUN: 11 mg/dL (ref 6–20)
CALCIUM: 9.1 mg/dL (ref 8.9–10.3)
CO2: 25 mmol/L (ref 22–32)
Chloride: 98 mmol/L — ABNORMAL LOW (ref 101–111)
Creatinine, Ser: 1.44 mg/dL — ABNORMAL HIGH (ref 0.61–1.24)
GFR calc Af Amer: >60 mL/min (ref >60)
GFR, EST NON AFRICAN AMERICAN: 57 mL/min — AB (ref >60)
Glucose, Bld: 288 mg/dL — ABNORMAL HIGH (ref 65–99)
POTASSIUM: 3.6 mmol/L (ref 3.5–5.1)
SODIUM: 138 mmol/L (ref 135–145)

## 2015-12-07 LAB — CBC
HCT: 45.3 % (ref 39.0–52.0)
Hemoglobin: 15.5 g/dL (ref 13.0–17.0)
MCH: 30.1 pg (ref 26.0–34.0)
MCHC: 34.2 g/dL (ref 30.0–36.0)
MCV: 88 fL (ref 78.0–100.0)
PLATELETS: 190 10*3/uL (ref 150–400)
RBC: 5.15 MIL/uL (ref 4.22–5.81)
RDW: 12.3 % (ref 11.5–15.5)
WBC: 13.3 10*3/uL — AB (ref 4.0–10.5)

## 2015-12-07 LAB — I-STAT TROPONIN, ED: TROPONIN I, POC: 0 ng/mL (ref 0.00–0.08)

## 2015-12-07 LAB — LIPASE, BLOOD: LIPASE: 42 U/L (ref 11–51)

## 2015-12-07 LAB — URINE MICROSCOPIC-ADD ON
BACTERIA UA: NONE SEEN
Squamous Epithelial / LPF: NONE SEEN

## 2015-12-07 MED ORDER — ACETAMINOPHEN 500 MG PO TABS
1000.0000 mg | ORAL_TABLET | Freq: Once | ORAL | Status: AC
  Administered 2015-12-07: 1000 mg via ORAL
  Filled 2015-12-07: qty 2

## 2015-12-07 MED ORDER — ONDANSETRON HCL 4 MG/2ML IJ SOLN
4.0000 mg | Freq: Once | INTRAMUSCULAR | Status: AC
Start: 2015-12-07 — End: 2015-12-07
  Administered 2015-12-07: 4 mg via INTRAVENOUS
  Filled 2015-12-07: qty 2

## 2015-12-07 MED ORDER — SODIUM CHLORIDE 0.9 % IV BOLUS (SEPSIS)
1000.0000 mL | Freq: Once | INTRAVENOUS | Status: AC
  Administered 2015-12-07: 1000 mL via INTRAVENOUS

## 2015-12-07 MED ORDER — ONDANSETRON 4 MG PO TBDP: 4.0000 mg | ORAL_TABLET | Freq: Three times a day (TID) | ORAL | Status: DC | PRN

## 2015-12-07 MED ORDER — SODIUM CHLORIDE 0.9 % IV BOLUS (SEPSIS)
2000.0000 mL | Freq: Once | INTRAVENOUS | Status: AC
  Administered 2015-12-07: 2000 mL via INTRAVENOUS

## 2015-12-07 MED ORDER — PANTOPRAZOLE SODIUM 40 MG IV SOLR
40.0000 mg | Freq: Once | INTRAVENOUS | Status: AC
Start: 2015-12-07 — End: 2015-12-07
  Administered 2015-12-07: 40 mg via INTRAVENOUS
  Filled 2015-12-07: qty 40

## 2015-12-07 NOTE — ED Notes (Signed)
Pt ambulated to restroom. 

## 2015-12-07 NOTE — ED Notes (Signed)
Patient arrives with complaint of persistent vomiting since 5pm yesterday. States vomiting has persisted all night. Denies sick contacts. DM2 present.

## 2015-12-07 NOTE — ED Provider Notes (Signed)
CSN: [id number]     Arrival date & time 12/07/15  [id number] History   First MD Initiated Contact with Patient 12/07/15 [phone]     Chief Complaint  Patient presents with  . Emesis  . Abdominal Pain      HPI  Patient presents for evaluation of nausea and vomiting. He was in his normal state of health during the day yesterday pressure feeling nauseated about 5 PM and vomited persistently until midnight. Slept poorly, but intermittently. Had episodes of retching and continued nausea but no additional vomiting. No diarrhea. No fevers or chills. No sick contacts. He has not checked a blood sugar at home. History of type 2 diabetes, non-insulin-dependent. Also hypertensive. To his morning medicines before coming in today and did keep them down.    Past Medical History  Diagnosis Date  . Diabetes mellitus 2004    type 2  . Hypertension   . Hyperlipidemia 05/16/2012   Past Surgical History  Procedure Laterality Date  . Elbow fracture surgery  as a child    left   Family History  Problem Relation Age of Onset  . Colon cancer Mother 65  . Diabetes Mother   . Diabetes Father   . Stroke Other     aunt  . CAD Other     GM, late onset  . Prostate cancer Neg Hx    Social History  Substance Use Topics  . Smoking status: Never Smoker   . Smokeless tobacco: Former Systems developer    Types: Snuff  . Alcohol Use: No    Review of Systems  Constitutional: Negative for fever, chills, diaphoresis, appetite change and fatigue.  HENT: Negative for mouth sores, sore throat and trouble swallowing.   Eyes: Negative for visual disturbance.  Respiratory: Negative for cough, chest tightness, shortness of breath and wheezing.   Cardiovascular: Negative for chest pain.  Gastrointestinal: Positive for nausea, vomiting and diarrhea. Negative for abdominal pain and abdominal distention.  Endocrine: Negative for polydipsia, polyphagia and polyuria.  Genitourinary: Negative for dysuria, frequency and hematuria.   Musculoskeletal: Negative for gait problem.  Skin: Negative for color change, pallor and rash.  Neurological: Negative for dizziness, syncope, light-headedness and headaches.  Hematological: Does not bruise/bleed easily.  Psychiatric/Behavioral: Negative for behavioral problems and confusion.      Allergies  Review of patient's allergies indicates no known allergies.  Home Medications   Prior to Admission medications   Medication Sig Start Date End Date Taking? Authorizing Provider  acetaminophen (TYLENOL) 500 MG tablet Take 500 mg by mouth 2 (two) times daily as needed (pain.).   Yes Historical Provider, MD  aspirin 81 MG tablet Take 81 mg by mouth daily.   Yes Historical Provider, MD  atorvastatin (LIPITOR) 20 MG tablet Take 1 tablet (20 mg total) by mouth daily. 06/09/15  Yes Colon Branch, MD  glimepiride (AMARYL) 4 MG tablet Take 1.5 tablets (6 mg total) by mouth daily with breakfast. 11/10/15  Yes Colon Branch, MD  glucose blood test strip Check blood sugar no more than twice daily. 04/25/15  Yes Colon Branch, MD  losartan-hydrochlorothiazide (HYZAAR) 100-25 MG per tablet Take 1 tablet by mouth daily. 05/30/15  Yes Colon Branch, MD  [facility] DELICA LANCETS FINE MISC Check blood sugar no more than twice daily. 04/25/15  Yes Colon Branch, MD  sitaGLIPtin-metformin (JANUMET) 50-1000 MG tablet Take 1 tablet by mouth 2 (two) times daily with a meal. 11/10/15  Yes Colon Branch, MD  HYDROcodone-acetaminophen (  NORCO/VICODIN) 5-325 MG tablet Take 1-2 tablets by mouth every 6 hours as needed for pain. 10/26/15   Nicole Pisciotta, PA-C  ondansetron (ZOFRAN ODT) 4 MG disintegrating tablet Take 1 tablet (4 mg total) by mouth every 8 (eight) hours as needed for nausea. 12/07/15   Tanna Furry, MD   BP 125/87 mmHg  Pulse 108  Temp(Src) 98.9 F (37.2 C) (Oral)  Resp 15  Ht 6' (1.829 m)  Wt 264 lb 1.6 oz (119.795 kg)  BMI 35.81 kg/m2  SpO2 93% Physical Exam  Constitutional: He is oriented to person, place, and  time. He appears well-developed and well-nourished. No distress.  HENT:  Head: Normocephalic.  Eyes: Conjunctivae are normal. Pupils are equal, round, and reactive to light. No scleral icterus.  Neck: Normal range of motion. Neck supple. No thyromegaly present.  Cardiovascular: Regular rhythm.  Tachycardia present.  Exam reveals no gallop and no friction rub.   No murmur heard. Sinus tach at 132 on the monitor.  Pulmonary/Chest: Effort normal and breath sounds normal. No respiratory distress. He has no wheezes. He has no rales.  Abdominal: Soft. Bowel sounds are normal. He exhibits no distension. There is no tenderness. There is no rebound.  Musculoskeletal: Normal range of motion.  Neurological: He is alert and oriented to person, place, and time.  Skin: Skin is warm and dry. No rash noted.  Psychiatric: He has a normal mood and affect. His behavior is normal.    ED Course  Procedures (including critical care time) Labs Review Labs Reviewed  BASIC METABOLIC PANEL - Abnormal; Notable for the following:    Chloride 98 (*)    Glucose, Bld 288 (*)    Creatinine, Ser 1.44 (*)    GFR calc non Af Amer 57 (*)    All other components within normal limits  CBC - Abnormal; Notable for the following:    WBC 13.3 (*)    All other components within normal limits  URINALYSIS, ROUTINE W REFLEX MICROSCOPIC (NOT AT [facility]) - Abnormal; Notable for the following:    Glucose, UA 500 (*)    Hgb urine dipstick TRACE (*)    Ketones, ur 15 (*)    Protein, ur 100 (*)    All other components within normal limits  CBG MONITORING, ED - Abnormal; Notable for the following:    Glucose-Capillary 287 (*)    All other components within normal limits  LIPASE, BLOOD  URINE MICROSCOPIC-ADD ON  Randolm Idol, ED    Imaging Review Dg Chest 2 View  12/07/2015  CLINICAL DATA:  Lower midline chest pain. Upper midline abdominal pain. Symptoms for 1 week. EXAM: CHEST  2 VIEW COMPARISON:  06/20/2008 FINDINGS:  There may be scarring in the right middle lobe which is unchanged. Otherwise, the lungs are clear. No evidence for airspace disease or pulmonary edema. Heart and mediastinum are within normal limits. Trachea is midline. Bridging osteophytes of in the lower thoracic spine. IMPRESSION: No active cardiopulmonary disease. Electronically Signed   By: Markus Daft M.D.   On: 12/07/2015 07:18   I have personally reviewed and evaluated these images and lab results as part of my medical decision-making.   EKG Interpretation None      MDM   Final diagnoses:  Non-intractable vomiting with nausea, vomiting of unspecified type    Heart rate improves with fluid. He has urinated. Low-grade temp 100.9. Improve the Tylenol. Awake alert , ambulatory, taking by mouth fluids.. Plan is discharge home.    Elta Guadeloupe  Jeneen Rinks, MD 12/07/15 1214

## 2015-12-07 NOTE — ED Notes (Signed)
Pt up to the bathroom

## 2015-12-07 NOTE — Discharge Instructions (Signed)
Nausea, Adult °Nausea is the feeling that you have an upset stomach or have to vomit. Nausea by itself is not likely a serious concern, but it may be an early sign of more serious medical problems. As nausea gets worse, it can lead to vomiting. If vomiting develops, there is the risk of dehydration.  °CAUSES  °· Viral infections. °· Food poisoning. °· Medicines. °· Pregnancy. °· Motion sickness. °· Migraine headaches. °· Emotional distress. °· Severe pain from any source. °· Alcohol intoxication. °HOME CARE INSTRUCTIONS °· Get plenty of rest. °· Ask your caregiver about specific rehydration instructions. °· Eat small amounts of food and sip liquids more often. °· Take all medicines as told by your caregiver. °SEEK MEDICAL CARE IF: °· You have not improved after 2 days, or you get worse. °· You have a headache. °SEEK IMMEDIATE MEDICAL CARE IF:  °· You have a fever. °· You faint. °· You keep vomiting or have blood in your vomit. °· You are extremely weak or dehydrated. °· You have dark or bloody stools. °· You have severe chest or abdominal pain. °MAKE SURE YOU: °· Understand these instructions. °· Will watch your condition. °· Will get help right away if you are not doing well or get worse. °  °This information is not intended to replace advice given to you by your health care provider. Make sure you discuss any questions you have with your health care provider. °  °Document Released: 10/04/2004 Document Revised: 09/17/2014 Document Reviewed: 05/09/2011 °Elsevier Interactive Patient Education ©2016 Elsevier Inc. ° °

## 2015-12-10 ENCOUNTER — Other Ambulatory Visit: Payer: Self-pay | Admitting: Internal Medicine

## 2015-12-17 ENCOUNTER — Other Ambulatory Visit: Payer: Self-pay | Admitting: Internal Medicine

## 2015-12-30 ENCOUNTER — Other Ambulatory Visit: Payer: Self-pay | Admitting: Internal Medicine

## 2016-01-01 ENCOUNTER — Other Ambulatory Visit: Payer: Self-pay | Admitting: Internal Medicine

## 2016-01-02 NOTE — Telephone Encounter (Signed)
Medication Detail      Disp Refills Start End     glimepiride (AMARYL) 4 MG tablet 45 tablet 1 12/12/2015     Sig - Route: Take 1.5 tablets (6 mg total) by mouth daily with breakfast. - Oral    Notes to Pharmacy: Requested drug refills are authorized, however, the patient needs further evaluation and/or laboratory testing before further refills are given. Ask him to make an appointment for this.    E-Prescribing Status: Receipt confirmed by pharmacy (12/12/2015 9:25 AM EDT)     Pharmacy    [id number] - [place], [place].

## 2016-01-19 ENCOUNTER — Telehealth: Payer: Self-pay | Admitting: Internal Medicine

## 2016-01-19 NOTE — Telephone Encounter (Signed)
LMOM informing Pt that I have placed Janumet savings card at front desk for him to pick up at his convenience. Instructed him to call if he has any further questions or concerns.

## 2016-01-19 NOTE — Telephone Encounter (Signed)
Please advise 

## 2016-01-19 NOTE — Telephone Encounter (Signed)
Pt called states that he took card to pharmacy and was told he can only use it once (for 12 months) and meds are too expensive. He wants med changed from [facility] to something else.

## 2016-01-19 NOTE — Telephone Encounter (Signed)
Can be reached: [phone] Pharmacy: [facility] #[id number] - [place], [place].  Reason for call: Pt went to refill Janumet and was told it was $130 because his savings card ran out. Do we have discount card for him? If not can med be changed. Pt requesting call back.

## 2016-01-20 MED ORDER — METFORMIN HCL 1000 MG PO TABS: 1000.0000 mg | ORAL_TABLET | Freq: Two times a day (BID) | ORAL | Status: DC

## 2016-01-20 NOTE — Telephone Encounter (Signed)
[facility] [address]. Spoke w/ Pt, informed him that Metformin has been sent to pharmacy and instructed how to take medication, Pt has scheduled fasting lab appt for 01/24/2016 at [place]. Informed him further advice and medication instructions with lab results. Pt verbalized understanding.

## 2016-01-20 NOTE — Addendum Note (Signed)
Addended by: Damita Dunnings D on: 01/20/2016 10:58 AM   Modules accepted: Orders, Medications

## 2016-01-20 NOTE — Telephone Encounter (Signed)
Get a AST, ALT, BMP, A1c, FLP today if possible (as recommended when he was at the office 3 months ago). Further advise with results Was refer to endocrinology, does he have an appointment scheduled?

## 2016-01-20 NOTE — Telephone Encounter (Signed)
Orders placed at [facility] in 10/2015, awaiting to be drawn. Per referral notes, Endo attempted to call the Pt 4-5 times and never received a telephone call back.

## 2016-01-20 NOTE — Telephone Encounter (Signed)
Spoke w/ Pt, informed him that Dr. Larose Kells and he had discussed seeing an Endocrinologist at his last OV. Pt stated he hasn't had time to get there yet. Informed him that Dr. Larose Kells requested he return to office in 10/2015 for labs which weren't completed. He informed me he is completely out of "sugar pills" and "what am I supposed to do in the meantime?" Informed him that he can come next week for labs, and pick up enough Janumet for 1 week. Pt stated he would see what he could do and would call back to schedule lab appt and/or let us know if he could not afford 1 week worth of the [facility].

## 2016-01-20 NOTE — Telephone Encounter (Signed)
Addendum: Metformin 1000 mg 1 tablet by mouth twice daily, #60 and 0RF.

## 2016-01-20 NOTE — Telephone Encounter (Signed)
Call metformin 1000 mg one tablet daily. 60 no refills. Will figure out  how to replace Januvia once we have the labs.

## 2016-01-24 ENCOUNTER — Other Ambulatory Visit (INDEPENDENT_AMBULATORY_CARE_PROVIDER_SITE_OTHER): Payer: BLUE CROSS/BLUE SHIELD

## 2016-01-24 DIAGNOSIS — E785 Hyperlipidemia, unspecified: Secondary | ICD-10-CM

## 2016-01-24 DIAGNOSIS — E119 Type 2 diabetes mellitus without complications: Secondary | ICD-10-CM | POA: Diagnosis not present

## 2016-01-24 DIAGNOSIS — I10 Essential (primary) hypertension: Secondary | ICD-10-CM | POA: Diagnosis not present

## 2016-01-24 DIAGNOSIS — E1165 Type 2 diabetes mellitus with hyperglycemia: Secondary | ICD-10-CM

## 2016-01-24 LAB — BASIC METABOLIC PANEL
BUN: 10 mg/dL (ref 6–23)
CALCIUM: 9.7 mg/dL (ref 8.4–10.5)
CO2: 26 meq/L (ref 19–32)
Chloride: 101 mEq/L (ref 96–112)
Creatinine, Ser: 1.11 mg/dL (ref 0.40–1.50)
GFR: 91.79 mL/min (ref >60.00)
Glucose, Bld: 281 mg/dL — ABNORMAL HIGH (ref 70–99)
Potassium: 3.5 mEq/L (ref 3.5–5.1)
SODIUM: 137 meq/L (ref 135–145)

## 2016-01-24 LAB — LIPID PANEL
CHOLESTEROL: 127 mg/dL (ref 0–200)
HDL: 37.9 mg/dL — ABNORMAL LOW (ref >39.00)
LDL Cholesterol: 52 mg/dL (ref 0–99)
NONHDL: 89.43
Total CHOL/HDL Ratio: 3
Triglycerides: 188 mg/dL — ABNORMAL HIGH (ref 0.0–149.0)
VLDL: 37.6 mg/dL (ref 0.0–40.0)

## 2016-01-24 LAB — HEMOGLOBIN A1C: HEMOGLOBIN A1C: 10.2 % — AB (ref 4.6–6.5)

## 2016-01-24 LAB — AST: AST: 19 U/L (ref 0–37)

## 2016-01-24 LAB — ALT: ALT: 26 U/L (ref 0–53)

## 2016-01-25 NOTE — Addendum Note (Signed)
Addended byDamita Dunnings D on: 01/25/2016 10:59 AM   Modules accepted: Orders

## 2016-01-31 ENCOUNTER — Other Ambulatory Visit: Payer: BLUE CROSS/BLUE SHIELD

## 2016-02-15 ENCOUNTER — Other Ambulatory Visit: Payer: Self-pay | Admitting: Internal Medicine

## 2016-02-22 ENCOUNTER — Encounter: Payer: Self-pay | Admitting: Endocrinology

## 2016-02-22 ENCOUNTER — Ambulatory Visit (INDEPENDENT_AMBULATORY_CARE_PROVIDER_SITE_OTHER): Payer: BLUE CROSS/BLUE SHIELD | Admitting: Endocrinology

## 2016-02-22 VITALS — BP 120/92 | HR 115 | Ht 73.0 in | Wt 264.6 lb

## 2016-02-22 DIAGNOSIS — Z794 Long term (current) use of insulin: Secondary | ICD-10-CM

## 2016-02-22 DIAGNOSIS — E119 Type 2 diabetes mellitus without complications: Secondary | ICD-10-CM

## 2016-02-22 DIAGNOSIS — N529 Male erectile dysfunction, unspecified: Secondary | ICD-10-CM | POA: Insufficient documentation

## 2016-02-22 MED ORDER — INSULIN GLARGINE 100 UNIT/ML SOLOSTAR PEN: 20.0000 [IU] | PEN_INJECTOR | SUBCUTANEOUS | Status: DC

## 2016-02-22 MED ORDER — SILDENAFIL CITRATE 100 MG PO TABS: 50.0000 mg | ORAL_TABLET | Freq: Every day | ORAL | Status: DC | PRN

## 2016-02-22 MED ORDER — GLUCOSE BLOOD VI STRP: 1.0000 | ORAL_STRIP | Freq: Two times a day (BID) | Status: AC

## 2016-02-22 NOTE — Patient Instructions (Addendum)
good diet and exercise significantly improve the control of your diabetes.  please let me know if you wish to be referred to a dietician.  high blood sugar is very risky to your health.  you should see an eye doctor and dentist every year.  It is very important to get all recommended vaccinations.  controlling your blood pressure and cholesterol drastically reduces the damage diabetes does to your body.  Those who smoke should quit.  please discuss these with your doctor.  check your blood sugar twice a day.  vary the time of day when you check, between before the 3 meals, and at bedtime.  also check if you have symptoms of your blood sugar being too high or too low.  please keep a record of the readings and bring it to your next appointment here (or you can bring the meter itself).  You can write it on any piece of paper.  please call us sooner if your blood sugar goes below 70, or if you have a lot of readings over 200. i have sent a prescription to your pharmacy, for the ED symptoms Please consider having weight loss surgery.  It is good for your health.  Here is some information about it.  If you decide to consider further, please call the phone number in the papers, and register for a free informational meeting.   i have sent a prescription to your pharmacy, for the insulin. Please continue the same diabetes pills for now, but we might stop them later.  Please call us next week, to tell us how the blood sugar is doing.   Please come back for a follow-up appointment in 1 month.

## 2016-02-22 NOTE — Progress Notes (Signed)
Subjective:    Patient ID: Charles Yoder, male    DOB: [birth year], 45 y.o.   MRN: [id number]  HPI pt states DM was dx'ed in 2002; he has mild if any neuropathy of the lower extremities; he is unaware of any associated chronic complications; he has never been on insulin; pt says his diet and exercise are good; he has never had pancreatitis, severe hypoglycemia or DKA.  He stopped janumet, due to cost.   Past Medical History  Diagnosis Date  . Diabetes mellitus 2004    type 2  . Hypertension   . Hyperlipidemia 05/16/2012    Past Surgical History  Procedure Laterality Date  . Elbow fracture surgery  as a child    left    Social History   Social History  . Marital Status: Married    Spouse Name: Secondary school teacher  . Number of Children: 2  . Years of Education: N/A   Occupational History  . 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 Smoking status: Never Smoker   . Smokeless tobacco: Former Systems developer    Types: Snuff  . Alcohol Use: No  . Drug Use: No  . Sexual Activity: Not on file   Other Topics Concern  . Not on file   Social History Narrative    Lives w/ wife and children   2 children 2000, and 2007           Current Outpatient Prescriptions on File Prior to Visit  Medication Sig Dispense Refill  . acetaminophen (TYLENOL) 500 MG tablet Take 500 mg by mouth 2 (two) times daily as needed (pain.).    Marland Kitchen aspirin 81 MG tablet Take 81 mg by mouth daily.    Marland Kitchen atorvastatin (LIPITOR) 20 MG tablet Take 1 tablet (20 mg total) by mouth daily. 30 tablet 1  . glimepiride (AMARYL) 4 MG tablet Take 1.5 tablets (6 mg total) by mouth daily with breakfast. 45 tablet 1  . HYDROcodone-acetaminophen (NORCO/VICODIN) 5-325 MG tablet Take 1-2 tablets by mouth every 6 hours as needed for pain. 15 tablet 0  . losartan-hydrochlorothiazide (HYZAAR) 100-25 MG tablet Take 1 tablet by mouth daily. 30 tablet 6  . metFORMIN (GLUCOPHAGE) 1000 MG tablet Take 1 tablet (1,000 mg total) by mouth  2 (two) times daily with a meal. 60 tablet 0  . ondansetron (ZOFRAN ODT) 4 MG disintegrating tablet Take 1 tablet (4 mg total) by mouth every 8 (eight) hours as needed for nausea. 6 tablet 0  . ONETOUCH DELICA LANCETS FINE MISC Check blood sugar no more than twice daily. 100 each 12   No current facility-administered medications on file prior to visit.    No Known Allergies  Family History  Problem Relation Age of Onset  . Colon cancer Mother 15  . Diabetes Mother   . Diabetes Father   . Stroke Other     aunt  . CAD Other     GM, late onset  . Prostate cancer Neg Hx     BP 120/92 mmHg  Pulse 115  Ht 6\' 1"  (1.854 m)  Wt 264 lb 9.6 oz (120.022 kg)  BMI 34.92 kg/m2  SpO2 96%  Review of Systems denies blurry vision, headache, chest pain, sob, n/v, muscle cramps, excessive diaphoresis, depression, cold intolerance, rhinorrhea, and easy bruising. He has lost weight, due to his efforts.  He has frequent urination and ED sxs.       Objective:   Physical Exam VS:  see vs page GEN: no distress HEAD: head: no deformity eyes: no periorbital swelling, no proptosis external nose and ears are normal mouth: no lesion seen NECK: supple, thyroid is not enlarged CHEST WALL: no deformity LUNGS: clear to auscultation CV: reg rate and rhythm, no murmur.   ABD: abdomen is soft, nontender.  no hepatosplenomegaly.  not distended.  Self-reducing ventral hernia MUSCULOSKELETAL: muscle bulk and strength are grossly normal.  no obvious joint swelling.  gait is normal and steady EXTEMITIES: no deformity.  no ulcer on the feet.  feet are of normal color and temp.  no edema PULSES: dorsalis pedis intact bilat.  no carotid bruit NEURO:  cn 2-12 grossly intact.   readily moves all 4's.  sensation is intact to touch on the feet SKIN:  Normal texture and temperature.  No rash or suspicious lesion is visible.   NODES:  None palpable at the neck.   PSYCH: alert, well-oriented.  Does not appear anxious nor  depressed.   Lab Results  Component Value Date   HGBA1C 10.2* 01/24/2016   i personally reviewed electrocardiogram tracing (12/07/15): Indication: n/v Impression: ST  I have reviewed outside records, and summarized:  Pt was noted to have elevated a1c, and was reluctant to take insulin.       Assessment & Plan:  Insulin-requiring type 2 DM, severe exacerbation: we discussed rx options.  I advised multiple daily injections, but he declines.  Weight loss, prob due to severe hyperglycemia: we'll follow.   Obesity, new to me. ED: new  Patient is advised the following: Patient Instructions  good diet and exercise significantly improve the control of your diabetes.  please let me know if you wish to be referred to a dietician.  high blood sugar is very risky to your health.  you should see an eye doctor and dentist every year.  It is very important to get all recommended vaccinations.  controlling your blood pressure and cholesterol drastically reduces the damage diabetes does to your body.  Those who smoke should quit.  please discuss these with your doctor.  check your blood sugar twice a day.  vary the time of day when you check, between before the 3 meals, and at bedtime.  also check if you have symptoms of your blood sugar being too high or too low.  please keep a record of the readings and bring it to your next appointment here (or you can bring the meter itself).  You can write it on any piece of paper.  please call us sooner if your blood sugar goes below 70, or if you have a lot of readings over 200. i have sent a prescription to your pharmacy, for the ED symptoms Please consider having weight loss surgery.  It is good for your health.  Here is some information about it.  If you decide to consider further, please call the phone number in the papers, and register for a free informational meeting.   i have sent a prescription to your pharmacy, for the insulin. Please continue the same  diabetes pills for now, but we might stop them later.  Please call us next week, to tell us how the blood sugar is doing.   Please come back for a follow-up appointment in 1 month.   Renato Shin, MD

## 2016-02-23 ENCOUNTER — Telehealth: Payer: Self-pay | Admitting: Endocrinology

## 2016-02-23 LAB — TSH: TSH: 2.84 u[IU]/mL (ref 0.35–4.50)

## 2016-02-23 NOTE — Telephone Encounter (Signed)
Patient stated Dr Loanne Drilling was going to give him a card to help pay for his insulin , he do not know the name of it, let him know when he can pick it up.

## 2016-02-23 NOTE — Telephone Encounter (Signed)
I contacted the pt and advised we do have a discount card for Lantus. Discount card placed upfront for the pt to pick up.

## 2016-02-24 LAB — TESTOSTERONE,FREE AND TOTAL
Testosterone, Free: 8 pg/mL (ref 6.8–21.5)
Testosterone: 306 ng/dL — ABNORMAL LOW (ref 348–1197)

## 2016-02-27 ENCOUNTER — Telehealth: Payer: Self-pay | Admitting: Endocrinology

## 2016-02-27 NOTE — Telephone Encounter (Signed)
Called patient, left message to call back. No response as of yet.

## 2016-03-01 ENCOUNTER — Other Ambulatory Visit: Payer: Self-pay | Admitting: Internal Medicine

## 2016-03-13 ENCOUNTER — Other Ambulatory Visit: Payer: Self-pay | Admitting: Internal Medicine

## 2016-03-17 ENCOUNTER — Other Ambulatory Visit: Payer: Self-pay | Admitting: Internal Medicine

## 2016-03-23 ENCOUNTER — Ambulatory Visit (INDEPENDENT_AMBULATORY_CARE_PROVIDER_SITE_OTHER): Payer: BLUE CROSS/BLUE SHIELD | Admitting: Endocrinology

## 2016-03-23 ENCOUNTER — Encounter: Payer: Self-pay | Admitting: Endocrinology

## 2016-03-23 VITALS — BP 118/90 | HR 96 | Temp 98.2°F | Wt 263.0 lb

## 2016-03-23 DIAGNOSIS — Z794 Long term (current) use of insulin: Secondary | ICD-10-CM

## 2016-03-23 LAB — POCT GLYCOSYLATED HEMOGLOBIN (HGB A1C): HEMOGLOBIN A1C: 11.9

## 2016-03-23 MED ORDER — SILDENAFIL CITRATE 20 MG PO TABS: ORAL_TABLET | ORAL | Status: DC

## 2016-03-23 MED ORDER — INSULIN GLARGINE 100 UNIT/ML SOLOSTAR PEN: 40.0000 [IU] | PEN_INJECTOR | SUBCUTANEOUS | Status: DC

## 2016-03-23 NOTE — Patient Instructions (Addendum)
check your blood sugar twice a day.  vary the time of day when you check, between before the 3 meals, and at bedtime.  also check if you have symptoms of your blood sugar being too high or too low.  please keep a record of the readings and bring it to your next appointment here (or you can bring the meter itself).  You can write it on any piece of paper.  please call us sooner if your blood sugar goes below 70, or if you have a lot of readings over 200. Please consider having weight loss surgery.  It is good for your health.  Here is some information about it.  If you decide to consider further, please call the phone number in the papers, and register for a free informational meeting.   Please increase the insulin to 40 units each morning, and:  Stop taking the glimepiride, and: Please continue the same metformin.  Please call us next week, to tell us how the blood sugar is doing.   I have sent a prescription to [facility], for the generic strength of viagra.  Please come back for a follow-up appointment in 6 weeks.

## 2016-03-23 NOTE — Progress Notes (Signed)
Subjective:    Patient ID: Charles Yoder, male    DOB: [birth year], 45 y.o.   MRN: [id number]  HPI Pt returns for f/u of diabetes mellitus: DM type: Insulin-requiring type 2 Dx'ed: 123XX123 Complications: none Therapy: insulin since 2017 GDM: never DKA: never Severe hypoglycemia: never Pancreatitis: never Other: he declines multiple daily injections Interval history: no cbg record, but states cbg's vary from 200-250.  There is no trend throughout the day. pt states he feels well in general. Past Medical History  Diagnosis Date  . Diabetes mellitus 2004    type 2  . Hypertension   . Hyperlipidemia 05/16/2012    Past Surgical History  Procedure Laterality Date  . Elbow fracture surgery  as a child    left    Social History   Social History  . Marital Status: Married    Spouse Name: Secondary school teacher  . Number of Children: 2  . Years of Education: N/A   Occupational History  . 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 Smoking status: Never Smoker   . Smokeless tobacco: Former Systems developer    Types: Snuff  . Alcohol Use: No  . Drug Use: No  . Sexual Activity: Not on file   Other Topics Concern  . Not on file   Social History Narrative    Lives w/ wife and children   2 children 2000, and 2007           Current Outpatient Prescriptions on File Prior to Visit  Medication Sig Dispense Refill  . aspirin 81 MG tablet Take 81 mg by mouth daily.    Marland Kitchen atorvastatin (LIPITOR) 20 MG tablet Take 1 tablet (20 mg total) by mouth daily. 30 tablet 3  . glucose blood test strip 1 each by Other route 2 (two) times daily. And lancets 2/day 100 each 12  . losartan-hydrochlorothiazide (HYZAAR) 100-25 MG tablet Take 1 tablet by mouth daily. 30 tablet 6  . metFORMIN (GLUCOPHAGE) 1000 MG tablet TAKE 1 TABLET (1,000 MG TOTAL) BY MOUTH 2 (TWO) TIMES DAILY WITH A MEAL. 60 tablet 0  . ONETOUCH DELICA LANCETS FINE MISC Check blood sugar no more than twice daily. 100 each 12   No  current facility-administered medications on file prior to visit.    No Known Allergies  Family History  Problem Relation Age of Onset  . Colon cancer Mother 41  . Diabetes Mother   . Diabetes Father   . Stroke Other     aunt  . CAD Other     GM, late onset  . Prostate cancer Neg Hx     BP 118/90 mmHg  Pulse 96  Temp(Src) 98.2 F (36.8 C) (Oral)  Wt 263 lb (119.296 kg)  Review of Systems He denies hypoglycemia.      Objective:   Physical Exam VITAL SIGNS:  See vs page GENERAL: no distress Pulses: dorsalis pedis intact bilat.   MSK: no deformity of the feet CV: no leg edema Skin:  no ulcer on the feet.  normal color and temp on the feet. Neuro: sensation is intact to touch on the feet.     A1c=11.9%    Assessment & Plan:  Insulin-requiring type 2 DM: he needs increased rx.    Patient is advised the following: Patient Instructions  check your blood sugar twice a day.  vary the time of day when you check, between before the 3 meals, and at bedtime.  also check if  you have symptoms of your blood sugar being too high or too low.  please keep a record of the readings and bring it to your next appointment here (or you can bring the meter itself).  You can write it on any piece of paper.  please call us sooner if your blood sugar goes below 70, or if you have a lot of readings over 200. Please consider having weight loss surgery.  It is good for your health.  Here is some information about it.  If you decide to consider further, please call the phone number in the papers, and register for a free informational meeting.   Please increase the insulin to 40 units each morning, and:  Stop taking the glimepiride, and: Please continue the same metformin.  Please call us next week, to tell us how the blood sugar is doing.   I have sent a prescription to [facility], for the generic strength of viagra.  Please come back for a follow-up appointment in 6 weeks.    Renato Shin, MD

## 2016-03-23 NOTE — Progress Notes (Signed)
Pre visit review using our clinic review tool, if applicable. No additional management support is needed unless otherwise documented below in the visit note. 

## 2016-03-27 ENCOUNTER — Encounter: Payer: BLUE CROSS/BLUE SHIELD | Attending: Endocrinology | Admitting: Nutrition

## 2016-03-27 DIAGNOSIS — Z713 Dietary counseling and surveillance: Secondary | ICD-10-CM | POA: Diagnosis not present

## 2016-03-27 DIAGNOSIS — E119 Type 2 diabetes mellitus without complications: Secondary | ICD-10-CM | POA: Insufficient documentation

## 2016-03-27 DIAGNOSIS — Z794 Long term (current) use of insulin: Secondary | ICD-10-CM | POA: Insufficient documentation

## 2016-04-04 ENCOUNTER — Other Ambulatory Visit: Payer: Self-pay | Admitting: Endocrinology

## 2016-04-04 ENCOUNTER — Telehealth: Payer: Self-pay | Admitting: Nutrition

## 2016-04-04 MED ORDER — INSULIN GLARGINE 100 UNIT/ML SOLOSTAR PEN: 60.0000 [IU] | PEN_INJECTOR | SUBCUTANEOUS | 99 refills | Status: DC

## 2016-04-04 NOTE — Telephone Encounter (Signed)
Please increase the insulin to 60 units each morning (I have sent a prescription to your pharmacy), and:  Please continue the same metformin.  Call in 5 days to report cbg's.

## 2016-04-04 NOTE — Telephone Encounter (Signed)
Patient called saying that his blood sugars over the weekend were in the 300s, but that he stopped eating the watermelon for 4 days and not the blood sugars are in the 250s.  FBS today was 260.  He is taking 40u q AM

## 2016-04-05 NOTE — Telephone Encounter (Signed)
I sent last week to [facility], at his request.

## 2016-04-05 NOTE — Telephone Encounter (Signed)
Pt was notified and verbalized understanding. He said that you and him talked about getting a prescription for erectile dysfunction?

## 2016-04-06 NOTE — Telephone Encounter (Signed)
lmtrc

## 2016-04-20 ENCOUNTER — Encounter: Payer: BLUE CROSS/BLUE SHIELD | Admitting: Internal Medicine

## 2016-04-20 DIAGNOSIS — Z0289 Encounter for other administrative examinations: Secondary | ICD-10-CM

## 2016-04-26 ENCOUNTER — Encounter: Payer: Self-pay | Admitting: Internal Medicine

## 2016-04-28 ENCOUNTER — Other Ambulatory Visit: Payer: Self-pay | Admitting: Endocrinology

## 2016-05-03 ENCOUNTER — Encounter: Payer: Self-pay | Admitting: Endocrinology

## 2016-05-03 ENCOUNTER — Ambulatory Visit (INDEPENDENT_AMBULATORY_CARE_PROVIDER_SITE_OTHER): Payer: BLUE CROSS/BLUE SHIELD | Admitting: Endocrinology

## 2016-05-03 VITALS — BP 136/84 | Ht 73.0 in | Wt 255.0 lb

## 2016-05-03 DIAGNOSIS — Z794 Long term (current) use of insulin: Secondary | ICD-10-CM

## 2016-05-03 DIAGNOSIS — E119 Type 2 diabetes mellitus without complications: Secondary | ICD-10-CM | POA: Diagnosis not present

## 2016-05-03 MED ORDER — INSULIN GLARGINE 100 UNIT/ML SOLOSTAR PEN: 80.0000 [IU] | PEN_INJECTOR | SUBCUTANEOUS | 99 refills | Status: DC

## 2016-05-03 NOTE — Patient Instructions (Addendum)
check your blood sugar twice a day.  vary the time of day when you check, between before the 3 meals, and at bedtime.  also check if you have symptoms of your blood sugar being too high or too low.  please keep a record of the readings and bring it to your next appointment here (or you can bring the meter itself).  You can write it on any piece of paper.  please call us sooner if your blood sugar goes below 70, or if you have a lot of readings over 200.   Please increase the insulin to 80 units each morning.   Please come back for a follow-up appointment in 2 months.

## 2016-05-03 NOTE — Progress Notes (Signed)
Subjective:    Patient ID: Charles Yoder, male    DOB: 1970-11-07, 45 y.o.   MRN: [id number]  HPI Pt returns for f/u of diabetes mellitus: DM type: Insulin-requiring type 2 Dx'ed: 123XX123 Complications: none Therapy: insulin since 2017 DKA: never Severe hypoglycemia: never.   Pancreatitis: never Other: he declines multiple daily injections; he declines weight loss surgery. Interval history: no cbg record, but states cbg's are still in the 200's.  He takes 60 units qam.  There is no trend throughout the day. pt states he feels well in general.   Past Medical History:  Diagnosis Date  . Diabetes mellitus 2004   type 2  . Hyperlipidemia 05/16/2012  . Hypertension     Past Surgical History:  Procedure Laterality Date  . ELBOW FRACTURE SURGERY  as a child   left    Social History   Social History  . Marital status: Married    Spouse name: Secondary school teacher  . Number of children: 2  . Years of education: N/A   Occupational History  . 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 Smoking status: Never Smoker  . Smokeless tobacco: Former Systems developer    Types: Snuff  . Alcohol use No  . Drug use: No  . Sexual activity: Not on file   Other Topics Concern  . Not on file   Social History Narrative    Lives w/ wife and children   2 children 2000, and 2007           Current Outpatient Prescriptions on File Prior to Visit  Medication Sig Dispense Refill  . aspirin 81 MG tablet Take 81 mg by mouth daily.    Marland Kitchen atorvastatin (LIPITOR) 20 MG tablet Take 1 tablet (20 mg total) by mouth daily. 30 tablet 3  . glucose blood test strip 1 each by Other route 2 (two) times daily. And lancets 2/day 100 each 12  . losartan-hydrochlorothiazide (HYZAAR) 100-25 MG tablet Take 1 tablet by mouth daily. 30 tablet 6  . metFORMIN (GLUCOPHAGE) 1000 MG tablet TAKE 1 TABLET BY MOUTH TWICE A DAY WITH A MEAL 60 tablet 0  . ONETOUCH DELICA LANCETS FINE MISC Check blood sugar no more than twice  daily. 100 each 12  . sildenafil (REVATIO) 20 MG tablet 1-5 pills, as needed for ED symptoms 30 tablet 11   No current facility-administered medications on file prior to visit.     No Known Allergies  Family History  Problem Relation Age of Onset  . Colon cancer Mother 84  . Diabetes Mother   . Diabetes Father   . Stroke Other     aunt  . CAD Other     GM, late onset  . Prostate cancer Neg Hx     BP 136/84   Ht 6\' 1"  (1.854 m)   Wt 255 lb (115.7 kg)   BMI 33.64 kg/m    Review of Systems He denies hypoglycemia.  He has lost 8 lbs, since last ov.      Objective:   Physical Exam VITAL SIGNS:  See vs page GENERAL: no distress Pulses: dorsalis pedis intact bilat.   MSK: no deformity of the feet.   CV: no leg edema.   Skin:  no ulcer on the feet.  normal color and temp on the feet.  Neuro: sensation is intact to touch on the feet.        Assessment & Plan:  Insulin-requiring type 2 DM:  he needs increased rx

## 2016-05-04 NOTE — Patient Instructions (Signed)
Test blood sugars in the morning and again before supper, or bedtime.  Call me the results on Monday:  [phone] Limit watermellon to no more than 1 1/2 slices per night.

## 2016-05-04 NOTE — Progress Notes (Signed)
Charles Yoder is here for dietary instruction and review of blood sugars.   His meals are well balanced, and he has started limiting  his carbs at each meal, but " blood sugars do not seem to be coming down" Breakfast is limited to 30 carbs, except will occasionally go up to 45, Lunch is 45, supper is variable, but usually not more than 60.  He denies eating between meals, but at HS will have "a lot of watermellon"  "several slices".   Discussed with him the portion sizes and suggested he limit it to no more than 1 1/2 slices at bedtime.   Taking Lantus-20u QD with no difficulty.  Rotating sites appropriately I advised him to test in the morning and again either before supper, or at bedtime and to call me the results next week.  He agreed to do this.

## 2016-05-24 ENCOUNTER — Other Ambulatory Visit: Payer: Self-pay | Admitting: Endocrinology

## 2016-06-05 ENCOUNTER — Other Ambulatory Visit: Payer: Self-pay | Admitting: Endocrinology

## 2016-07-03 ENCOUNTER — Ambulatory Visit: Payer: BLUE CROSS/BLUE SHIELD | Admitting: Endocrinology

## 2016-07-05 ENCOUNTER — Other Ambulatory Visit: Payer: Self-pay | Admitting: Internal Medicine

## 2016-07-06 ENCOUNTER — Other Ambulatory Visit: Payer: Self-pay | Admitting: Endocrinology

## 2016-07-17 ENCOUNTER — Ambulatory Visit: Payer: BLUE CROSS/BLUE SHIELD | Admitting: Endocrinology

## 2016-07-17 DIAGNOSIS — Z0289 Encounter for other administrative examinations: Secondary | ICD-10-CM

## 2016-08-02 ENCOUNTER — Other Ambulatory Visit: Payer: Self-pay | Admitting: Internal Medicine

## 2016-08-03 ENCOUNTER — Other Ambulatory Visit: Payer: Self-pay | Admitting: Endocrinology

## 2016-08-03 ENCOUNTER — Other Ambulatory Visit: Payer: Self-pay | Admitting: Internal Medicine

## 2016-08-06 ENCOUNTER — Other Ambulatory Visit: Payer: Self-pay | Admitting: Internal Medicine

## 2016-09-04 ENCOUNTER — Other Ambulatory Visit: Payer: Self-pay | Admitting: Endocrinology

## 2016-09-12 ENCOUNTER — Encounter: Payer: BLUE CROSS/BLUE SHIELD | Admitting: Internal Medicine

## 2016-09-12 ENCOUNTER — Telehealth: Payer: Self-pay | Admitting: Internal Medicine

## 2016-09-12 NOTE — Telephone Encounter (Addendum)
Patient cancelled he's 3:30pm appointment today due to work conflict and [facility] to 03/14/09, charge or no charge

## 2016-09-12 NOTE — Telephone Encounter (Signed)
No, thx 

## 2016-09-14 DIAGNOSIS — E119 Type 2 diabetes mellitus without complications: Secondary | ICD-10-CM | POA: Diagnosis not present

## 2016-09-14 LAB — HM DIABETES EYE EXAM

## 2016-09-20 ENCOUNTER — Encounter: Payer: Self-pay | Admitting: Internal Medicine

## 2016-10-02 ENCOUNTER — Other Ambulatory Visit: Payer: Self-pay | Admitting: Internal Medicine

## 2016-10-03 ENCOUNTER — Other Ambulatory Visit: Payer: Self-pay | Admitting: Endocrinology

## 2016-10-19 ENCOUNTER — Encounter: Payer: Self-pay | Admitting: Internal Medicine

## 2016-10-19 ENCOUNTER — Ambulatory Visit (INDEPENDENT_AMBULATORY_CARE_PROVIDER_SITE_OTHER): Payer: BLUE CROSS/BLUE SHIELD | Admitting: Internal Medicine

## 2016-10-19 VITALS — BP 126/74 | HR 108 | Temp 97.5°F | Resp 14 | Ht 73.0 in | Wt 246.5 lb

## 2016-10-19 DIAGNOSIS — E119 Type 2 diabetes mellitus without complications: Secondary | ICD-10-CM | POA: Diagnosis not present

## 2016-10-19 DIAGNOSIS — Z0000 Encounter for general adult medical examination without abnormal findings: Secondary | ICD-10-CM | POA: Diagnosis not present

## 2016-10-19 DIAGNOSIS — Z794 Long term (current) use of insulin: Secondary | ICD-10-CM | POA: Diagnosis not present

## 2016-10-19 DIAGNOSIS — Z800 Family history of malignant neoplasm of digestive organs: Secondary | ICD-10-CM

## 2016-10-19 DIAGNOSIS — Z23 Encounter for immunization: Secondary | ICD-10-CM

## 2016-10-19 LAB — BASIC METABOLIC PANEL
BUN: 11 mg/dL (ref 7–25)
CHLORIDE: 102 mmol/L (ref 98–110)
CO2: 26 mmol/L (ref 20–31)
Calcium: 9.9 mg/dL (ref 8.6–10.3)
Creat: 1.27 mg/dL (ref 0.60–1.35)
Glucose, Bld: 250 mg/dL — ABNORMAL HIGH (ref 65–99)
POTASSIUM: 3.8 mmol/L (ref 3.5–5.3)
SODIUM: 138 mmol/L (ref 135–146)

## 2016-10-19 LAB — ALT: ALT: 13 U/L (ref 9–46)

## 2016-10-19 LAB — CBC WITH DIFFERENTIAL/PLATELET
BASOS ABS: 0 {cells}/uL (ref 0–200)
Basophils Relative: 0 %
EOS PCT: 1 %
Eosinophils Absolute: 107 cells/uL (ref 15–500)
HCT: 45.4 % (ref 38.5–50.0)
HEMOGLOBIN: 15.6 g/dL (ref 13.2–17.1)
LYMPHS ABS: 2889 {cells}/uL (ref 850–3900)
Lymphocytes Relative: 27 %
MCH: 30.3 pg (ref 27.0–33.0)
MCHC: 34.4 g/dL (ref 32.0–36.0)
MCV: 88.2 fL (ref 80.0–100.0)
MPV: 12 fL (ref 7.5–12.5)
Monocytes Absolute: 749 cells/uL (ref 200–950)
Monocytes Relative: 7 %
NEUTROS PCT: 65 %
Neutro Abs: 6955 cells/uL (ref 1500–7800)
Platelets: 172 10*3/uL (ref 140–400)
RBC: 5.15 MIL/uL (ref 4.20–5.80)
RDW: 12.8 % (ref 11.0–15.0)
WBC: 10.7 10*3/uL (ref 3.8–10.8)

## 2016-10-19 LAB — PSA: PSA: 1 ng/mL (ref <=4.0)

## 2016-10-19 LAB — AST: AST: 14 U/L (ref 10–40)

## 2016-10-19 NOTE — Progress Notes (Signed)
Subjective:    Patient ID: Charles Yoder, male    DOB: 03-Nov-1970, 46 y.o.   MRN: [id number]  DOS:  10/19/2016 Type of visit - description : cpx Interval history: He is one concern about DM, good med compliance, CBGs in the 230s in the morning when checked. + Weight loss, states is eating better. No sx c/w  low blood sugars except some sweating at night.  Wt Readings from Last 3 Encounters:  10/19/16 246 lb 8 oz (111.8 kg)  05/03/16 255 lb (115.7 kg)  03/23/16 263 lb (119.3 kg)     Review of Systems  Constitutional: No fever. No chills. No unexplained wt changes. No unusual sweats  HEENT: No dental problems, no ear discharge, no facial swelling, no voice changes. No eye discharge, no eye  redness , no  intolerance to light   Respiratory: No wheezing , no  difficulty breathing. No cough , no mucus production  Cardiovascular: No CP, no leg swelling , no  Palpitations  GI: no nausea, no vomiting, no diarrhea , no  abdominal pain.  No blood in the stools. No dysphagia, no odynophagia    Endocrine: No polyphagia, no polyuria , no polydipsia  GU: No dysuria, gross hematuria, difficulty urinating. No urinary urgency, no frequency.  Musculoskeletal: No joint swellings or unusual aches or pains  Skin: No change in the color of the skin, palor , no  Rash  Allergic, immunologic: No environmental allergies , no  food allergies  Neurological: No dizziness no  syncope. No headaches. No diplopia, no slurred, no slurred speech, no motor deficits, no facial  Numbness  Hematological: No enlarged lymph nodes, no easy bruising , no unusual bleedings  Psychiatry: No suicidal ideas, no hallucinations, no beavior problems, no confusion.  No unusual/severe anxiety, no depression   Past Medical History:  Diagnosis Date  . Diabetes mellitus 2004   type 2  . Hyperlipidemia 05/16/2012  . Hypertension     Past Surgical History:  Procedure Laterality Date  . ELBOW FRACTURE SURGERY  as a  child   left    Social History   Social History  . Marital status: Married    Spouse name: Secondary school teacher  . Number of children: 2  . Years of education: N/A   Occupational History  . 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 Smoking status: Never Smoker  . Smokeless tobacco: Former Systems developer    Types: Snuff  . Alcohol use No  . Drug use: No  . Sexual activity: Not on file   Other Topics Concern  . Not on file   Social History Narrative   Lives w/ wife and children   2 children 2000, and 2007            Family History  Problem Relation Age of Onset  . Colon cancer Mother 70  . Diabetes Mother   . Diabetes Father   . Stroke Other     aunt  . CAD Other     GM, late onset  . Prostate cancer Neg Hx      Allergies as of 10/19/2016   No Known Allergies     Medication List       Accurate as of 10/19/16 11:59 PM. Always use your most recent med list.          aspirin 81 MG tablet Take 81 mg by mouth daily.   atorvastatin 20 MG tablet Commonly known as:  LIPITOR  Take 1 tablet (20 mg total) by mouth daily.   glucose blood test strip 1 each by Other route 2 (two) times daily. And lancets 2/day   ONETOUCH VERIO test strip Generic drug:  glucose blood CHECK BLOOD SUGAR NO MORE THAN TWICE DAILY   Insulin Glargine 100 UNIT/ML Solostar Pen Commonly known as:  LANTUS SOLOSTAR Inject 80 Units into the skin every morning. And pen needles 1/day   losartan-hydrochlorothiazide 100-25 MG tablet Commonly known as:  HYZAAR Take 1 tablet by mouth daily.   metFORMIN 1000 MG tablet Commonly known as:  GLUCOPHAGE TAKE 1 TABLET BY MOUTH TWICE A DAY WITH A MEAL   ONETOUCH DELICA LANCETS FINE Misc Check blood sugar no more than twice daily.   sildenafil 20 MG tablet Commonly known as:  REVATIO 1-5 pills, as needed for ED symptoms          Objective:   Physical Exam BP 126/74 (BP Location: Left Arm, Patient Position: Sitting, Cuff Size: Normal)    Pulse (!) 108   Temp 97.5 F (36.4 C) (Oral)   Resp 14   Ht 6\' 1"  (1.854 m)   Wt 246 lb 8 oz (111.8 kg)   SpO2 98%   BMI 32.52 kg/m   General:   Well developed, well nourished . NAD.  Neck: No  thyromegaly  HEENT:  Normocephalic . Face symmetric, atraumatic Lungs:  CTA B Normal respiratory effort, no intercostal retractions, no accessory muscle use. Heart: RRR,  no murmur.  No pretibial edema bilaterally  Abdomen:  Not distended, soft, non-tender. No rebound or rigidity.   Skin: Exposed areas without rash. Not pale. Not jaundice Rectal:  External abnormalities: none. Normal sphincter tone. No rectal masses or tenderness.  No stools found Prostate: Prostate gland firm and smooth, no enlargement, nodularity, tenderness, mass, asymmetry or induration.  Neurologic:  alert & oriented X3.  Speech normal, gait appropriate for age and unassisted Strength symmetric and appropriate for age.  Psych: Cognition and judgment appear intact.  Cooperative with normal attention span and concentration.  Behavior appropriate. No anxious or depressed appearing.    Assessment & Plan:   Assessment DM 2004 HTN Hyperlipidemia  PLAN: DM: by 10-2015 he was on Janumet and glimepiride. DM not well controlled, saw endocrinology 4, spoke to a nutritionist, trying to do better with diet. Currently on Lantus 80 units and metformin. CBGs is still elevated in the 230s in a.m.. He is losing weight, may be from poorly controlled DM. Would like PCP to handle DM again. Plan: Check A1c, further advise with results (re-refer,? Increase lantus? Will need  CBGs 2-3 /day if likes better control)  HTN: No ambulatory BPs, BP today is very good, on Hyzaar. Labs. RTC 3 months

## 2016-10-19 NOTE — Patient Instructions (Signed)
GO TO THE LAB : Get the blood work     GO TO THE FRONT DESK Schedule your next appointment for a  routine check up in 3 months. FASTING

## 2016-10-19 NOTE — Progress Notes (Signed)
Pre visit review using our clinic review tool, if applicable. No additional management support is needed unless otherwise documented below in the visit note. 

## 2016-10-19 NOTE — Assessment & Plan Note (Signed)
Td 10-10; PNM 23 today ; prevnar 10-2016 ; had a flu shot  Colon cancer screening: FH colon Ca - M, dx in her 64s ---->  Needs a cscope, failed previous referral, refer again Prostate cancer screening: Patient is African-American, start early screening, DRE normal, check a PSA Counseling:Diet-exercise discussed , reportedly doing better Labs: BMP, AST, ALT, CBC, A1c, PSA

## 2016-10-20 LAB — HEMOGLOBIN A1C
HEMOGLOBIN A1C: 12.1 % — AB (ref <5.7)
MEAN PLASMA GLUCOSE: 301 mg/dL

## 2016-10-21 DIAGNOSIS — Z09 Encounter for follow-up examination after completed treatment for conditions other than malignant neoplasm: Secondary | ICD-10-CM | POA: Insufficient documentation

## 2016-10-21 NOTE — Assessment & Plan Note (Signed)
DM: by 10-2015 he was on Janumet and glimepiride. DM not well controlled, saw endocrinology 4, spoke to a nutritionist, trying to do better with diet. Currently on Lantus 80 units and metformin. CBGs is still elevated in the 230s in a.m.. He is losing weight, may be from poorly controlled DM. Would like PCP to handle DM again. Plan: Check A1c, further advise with results (re-refer,? Increase lantus? Will need  CBGs 2-3 /day if likes better control)  HTN: No ambulatory BPs, BP today is very good, on Hyzaar. Labs. RTC 3 months

## 2016-10-23 NOTE — Addendum Note (Signed)
Addended byDamita Dunnings D on: 10/23/2016 03:13 PM   Modules accepted: Orders

## 2016-11-02 ENCOUNTER — Other Ambulatory Visit: Payer: Self-pay | Admitting: Internal Medicine

## 2016-11-02 ENCOUNTER — Other Ambulatory Visit: Payer: Self-pay | Admitting: Endocrinology

## 2016-11-08 ENCOUNTER — Other Ambulatory Visit: Payer: Self-pay | Admitting: Internal Medicine

## 2016-11-08 ENCOUNTER — Telehealth: Payer: Self-pay | Admitting: Internal Medicine

## 2016-11-08 NOTE — Telephone Encounter (Signed)
error:[id number] ° °

## 2016-12-09 ENCOUNTER — Other Ambulatory Visit: Payer: Self-pay | Admitting: Endocrinology

## 2016-12-09 NOTE — Telephone Encounter (Signed)
Please refill x 1 Ov is due  

## 2017-01-06 ENCOUNTER — Other Ambulatory Visit: Payer: Self-pay | Admitting: Endocrinology

## 2017-01-06 NOTE — Telephone Encounter (Signed)
Please refill x 1 Ov is due  

## 2017-01-10 ENCOUNTER — Other Ambulatory Visit: Payer: Self-pay | Admitting: Endocrinology

## 2017-03-08 ENCOUNTER — Other Ambulatory Visit: Payer: Self-pay | Admitting: Endocrinology

## 2017-03-08 ENCOUNTER — Other Ambulatory Visit: Payer: Self-pay | Admitting: Internal Medicine

## 2017-04-06 ENCOUNTER — Other Ambulatory Visit: Payer: Self-pay | Admitting: Internal Medicine

## 2017-04-08 ENCOUNTER — Other Ambulatory Visit: Payer: Self-pay

## 2017-04-09 ENCOUNTER — Encounter: Payer: Self-pay | Admitting: Internal Medicine

## 2017-04-09 ENCOUNTER — Ambulatory Visit (INDEPENDENT_AMBULATORY_CARE_PROVIDER_SITE_OTHER): Payer: BLUE CROSS/BLUE SHIELD | Admitting: Internal Medicine

## 2017-04-09 VITALS — BP 132/80 | HR 96 | Temp 98.0°F | Resp 14 | Ht 73.0 in | Wt 252.4 lb

## 2017-04-09 DIAGNOSIS — I10 Essential (primary) hypertension: Secondary | ICD-10-CM

## 2017-04-09 DIAGNOSIS — E785 Hyperlipidemia, unspecified: Secondary | ICD-10-CM | POA: Diagnosis not present

## 2017-04-09 DIAGNOSIS — E1165 Type 2 diabetes mellitus with hyperglycemia: Secondary | ICD-10-CM | POA: Diagnosis not present

## 2017-04-09 DIAGNOSIS — Z794 Long term (current) use of insulin: Secondary | ICD-10-CM

## 2017-04-09 DIAGNOSIS — IMO0001 Reserved for inherently not codable concepts without codable children: Secondary | ICD-10-CM

## 2017-04-09 LAB — LIPID PANEL
CHOLESTEROL: 121 mg/dL (ref 0–200)
HDL: 44.7 mg/dL (ref >39.00)
LDL CALC: 46 mg/dL (ref 0–99)
NonHDL: 76.53
Total CHOL/HDL Ratio: 3
Triglycerides: 153 mg/dL — ABNORMAL HIGH (ref 0.0–149.0)
VLDL: 30.6 mg/dL (ref 0.0–40.0)

## 2017-04-09 LAB — BASIC METABOLIC PANEL
BUN: 17 mg/dL (ref 6–23)
CO2: 27 mEq/L (ref 19–32)
Calcium: 9.7 mg/dL (ref 8.4–10.5)
Chloride: 101 mEq/L (ref 96–112)
Creatinine, Ser: 1.15 mg/dL (ref 0.40–1.50)
GFR: 87.65 mL/min (ref >60.00)
GLUCOSE: 259 mg/dL — AB (ref 70–99)
Potassium: 3.5 mEq/L (ref 3.5–5.1)
Sodium: 135 mEq/L (ref 135–145)

## 2017-04-09 LAB — HEMOGLOBIN A1C: HEMOGLOBIN A1C: 12.6 % — AB (ref 4.6–6.5)

## 2017-04-09 MED ORDER — LOSARTAN POTASSIUM-HCTZ 100-25 MG PO TABS
1.0000 | ORAL_TABLET | Freq: Every day | ORAL | 1 refills | Status: DC
Start: 2017-04-09 — End: 2017-11-12

## 2017-04-09 NOTE — Progress Notes (Signed)
Subjective:    Patient ID: Charles Yoder, male    DOB: [birth year], 46 y.o.   MRN: [id number]  DOS:  04/09/2017 Type of visit - description : rov Interval history: Reports good compliance with medication, on insulin 100 units daily. Check his blood sugars most days, only at bedtime, they are mostly in the 300s. When asked, admits that from time to time he feels slightly shaky but has not checking CBGs when he feels that way. He is trying to eat  healthy, avoiding excessive fats and carbohydrates. HTN: Good compliance of medications. High cholesterol due for labs    Review of Systems   Past Medical History:  Diagnosis Date  . Diabetes mellitus 2004   type 2  . Hyperlipidemia 05/16/2012  . Hypertension     Past Surgical History:  Procedure Laterality Date  . ELBOW FRACTURE SURGERY  as a child   left    Social History   Social History  . Marital status: Married    Spouse name: Secondary school teacher  . Number of children: 2  . Years of education: N/A   Occupational History  . 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 Smoking status: Never Smoker  . Smokeless tobacco: Former Systems developer    Types: Snuff  . Alcohol use No  . Drug use: No  . Sexual activity: Not on file   Other Topics Concern  . Not on file   Social History Narrative   Lives w/ wife and children   2 children 2000, and 2007             Allergies as of 04/09/2017   No Known Allergies     Medication List       Accurate as of 04/09/17 11:59 PM. Always use your most recent med list.          aspirin 81 MG tablet Take 81 mg by mouth daily.   atorvastatin 20 MG tablet Commonly known as:  LIPITOR Take 1 tablet (20 mg total) by mouth daily.   glucose blood test strip 1 each by Other route 2 (two) times daily. And lancets 2/day   ONETOUCH VERIO test strip Generic drug:  glucose blood CHECK BLOOD SUGAR NO MORE THAN TWICE DAILY   LANTUS SOLOSTAR 100 UNIT/ML Solostar Pen Generic drug:   Insulin Glargine Inject 100 Units into the skin every morning. And pen needles 1/day   losartan-hydrochlorothiazide 100-25 MG tablet Commonly known as:  HYZAAR Take 1 tablet by mouth daily.   metFORMIN 1000 MG tablet Commonly known as:  GLUCOPHAGE TAKE 1 TABLET BY MOUTH TWICE A DAY WITH A MEAL   ONETOUCH DELICA LANCETS FINE Misc Check blood sugar no more than twice daily.   sildenafil 20 MG tablet Commonly known as:  REVATIO 1-5 pills, as needed for ED symptoms          Objective:   Physical Exam BP 132/80 (BP Location: Left Arm, Patient Position: Sitting, Cuff Size: Normal)   Pulse 96   Temp 98 F (36.7 C) (Oral)   Resp 14   Ht 6\' 1"  (1.854 m)   Wt 252 lb 6 oz (114.5 kg)   SpO2 97%   BMI 33.30 kg/m  General:   Well developed, well nourished . NAD.  HEENT:  Normocephalic . Face symmetric, atraumatic Lungs:  CTA B Normal respiratory effort, no intercostal retractions, no accessory muscle use. Heart: RRR,  no murmur.  No pretibial edema bilaterally  Skin:  Not pale. Not jaundice Neurologic:  alert & oriented X3.  Speech normal, gait appropriate for age and unassisted Psych--  Cognition and judgment appear intact.  Cooperative with normal attention span and concentration.  Behavior appropriate. No anxious or depressed appearing.      Assessment & Plan:     Assessment DM 2004 HTN Hyperlipidemia  PLAN:  DM: Good compliance with metformin and insulin 100 units. Has not contacted me (as recommended) since the last visit few months ago, CBGs check was in the 200- 300s. Explained the patient the concept of diabetes legacy,he has not been well-controlled for at least 6 years. Risk of MI, stroke and early death discussed. Plan: Labs, refer back to endocrinology HTN: Continue with Hyzaar, check a BMP High cholesterol: On Lipitor, check FLP RTC 10-2016, CPX. Will see endocrinology in the meantime

## 2017-04-09 NOTE — Patient Instructions (Addendum)
GO TO THE LAB : Get the blood work     GO TO THE FRONT DESK Schedule your next appointment for a   next visit for a physical exam by February 2019   We are referring you to Dr. Loanne Drilling  Check your blood sugars at least 2 times a day, write results down and share the results with Dr. Loanne Drilling  Check the  blood pressure 2 or 3 times a month    Be sure your blood pressure is between 110/65 and  145/85. If it is consistently higher or lower, let me know

## 2017-04-09 NOTE — Progress Notes (Signed)
Pre visit review using our clinic review tool, if applicable. No additional management support is needed unless otherwise documented below in the visit note. 

## 2017-04-10 ENCOUNTER — Other Ambulatory Visit: Payer: Self-pay | Admitting: Endocrinology

## 2017-04-10 NOTE — Assessment & Plan Note (Signed)
DM: Good compliance with metformin and insulin 100 units. Has not contacted me (as recommended) since the last visit few months ago, CBGs check was in the 200- 300s. Explained the patient the concept of diabetes legacy,he has not been well-controlled for at least 6 years. Risk of MI, stroke and early death discussed. Plan: Labs, refer back to endocrinology HTN: Continue with Hyzaar, check a BMP High cholesterol: On Lipitor, check FLP RTC 10-2016, CPX. Will see endocrinology in the meantime

## 2017-05-07 ENCOUNTER — Other Ambulatory Visit: Payer: Self-pay

## 2017-05-07 ENCOUNTER — Other Ambulatory Visit: Payer: Self-pay | Admitting: Endocrinology

## 2017-05-07 MED ORDER — METFORMIN HCL 1000 MG PO TABS: ORAL_TABLET | ORAL | 0 refills | Status: DC

## 2017-06-06 ENCOUNTER — Other Ambulatory Visit: Payer: Self-pay | Admitting: Endocrinology

## 2017-07-03 ENCOUNTER — Encounter: Payer: Self-pay | Admitting: Endocrinology

## 2017-07-03 ENCOUNTER — Ambulatory Visit (INDEPENDENT_AMBULATORY_CARE_PROVIDER_SITE_OTHER): Payer: BLUE CROSS/BLUE SHIELD | Admitting: Endocrinology

## 2017-07-03 VITALS — BP 138/82 | HR 103 | Wt 265.2 lb

## 2017-07-03 DIAGNOSIS — E119 Type 2 diabetes mellitus without complications: Secondary | ICD-10-CM

## 2017-07-03 MED ORDER — INSULIN GLARGINE 100 UNIT/ML SOLOSTAR PEN: 60.0000 [IU] | PEN_INJECTOR | SUBCUTANEOUS | 1 refills | Status: DC

## 2017-07-03 NOTE — Patient Instructions (Addendum)
check your blood sugar twice a day.  vary the time of day when you check, between before the 3 meals, and at bedtime.  also check if you have symptoms of your blood sugar being too high or too low.  please keep a record of the readings and bring it to your next appointment here (or you can bring the meter itself).  You can write it on any piece of paper.  please call us sooner if your blood sugar goes below 70, or if you have a lot of readings over 200.   Please increase the insulin to 60 units each morning.  I have sent a prescription to your pharmacy.  Please call or message [place] next week, to tell us how the blood sugar is doing.  Please come back for a follow-up appointment in 3 months.

## 2017-07-03 NOTE — Progress Notes (Signed)
Subjective:    Patient ID: Charles Yoder, male    DOB: [birth year], 46 y.o.   MRN: [id number]  HPI Pt returns for f/u of diabetes mellitus: DM type: Insulin-requiring type 2 Dx'ed: 9485 Complications: none Therapy: insulin since 2017 DKA: never Severe hypoglycemia: never.   Pancreatitis: never Other: he declines multiple daily injections; he declines weight loss surgery. Interval history: no cbg record, but states cbg's still vary from 200-320.  He takes just 12 units qam.  There is no trend throughout the day.  pt states he feels well in general. Pt says he never misses the insulin.   Past Medical History:  Diagnosis Date  . Diabetes mellitus 2004   type 2  . Hyperlipidemia 05/16/2012  . Hypertension     Past Surgical History:  Procedure Laterality Date  . ELBOW FRACTURE SURGERY  as a child   left    Social History   Social History  . Marital status: Married    Spouse name: Secondary school teacher  . Number of children: 2  . Years of education: N/A   Occupational History  . 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 Smoking status: Never Smoker  . Smokeless tobacco: Former Systems developer    Types: Snuff  . Alcohol use No  . Drug use: No  . Sexual activity: Not on file   Other Topics Concern  . Not on file   Social History Narrative   Lives w/ wife and children   2 children 2000, and 2007           Current Outpatient Prescriptions on File Prior to Visit  Medication Sig Dispense Refill  . aspirin 81 MG tablet Take 81 mg by mouth daily.    Marland Kitchen atorvastatin (LIPITOR) 20 MG tablet Take 1 tablet (20 mg total) by mouth daily. 30 tablet 0  . B-D ULTRAFINE III SHORT PEN 31G X 8 MM MISC USE ONCE A DAY 100 each 2  . glucose blood test strip 1 each by Other route 2 (two) times daily. And lancets 2/day 100 each 12  . losartan-hydrochlorothiazide (HYZAAR) 100-25 MG tablet Take 1 tablet by mouth daily. 90 tablet 1  . metFORMIN (GLUCOPHAGE) 1000 MG tablet TAKE 1 TABLET BY  MOUTH TWICE A DAY WITH A MEAL 60 tablet 0  . ONETOUCH DELICA LANCETS FINE MISC Check blood sugar no more than twice daily. 100 each 12  . ONETOUCH VERIO test strip CHECK BLOOD SUGAR NO MORE THAN TWICE DAILY 180 each 3  . sildenafil (REVATIO) 20 MG tablet 1-5 pills, as needed for ED symptoms 30 tablet 11   No current facility-administered medications on file prior to visit.     No Known Allergies  Family History  Problem Relation Age of Onset  . Colon cancer Mother 109  . Diabetes Mother   . Diabetes Father   . Stroke Other        aunt  . CAD Other        GM, late onset  . Prostate cancer Neg Hx     BP 138/82   Pulse (!) 103   Wt 265 lb 3.2 oz (120.3 kg)   SpO2 97%   BMI 34.99 kg/m   Review of Systems He denies hypoglycemia.  He has gained a few lbs.      Objective:   Physical Exam VITAL SIGNS:  See vs page GENERAL: no distress Pulses: foot pulses are intact bilaterally.   MSK: no deformity  of the feet or ankles.  CV: no edema of the legs or ankles Skin:  no ulcer on the feet or ankles.  normal color and temp on the feet and ankles.  Neuro: sensation is intact to touch on the feet and ankles.    Lab Results  Component Value Date   HGBA1C 12.6 (H) 04/09/2017      Assessment & Plan:  Insulin-requiring type 2 DM: he needs increased rx.   Obesity: worse. This is increasing insulin requirement.  Patient Instructions  check your blood sugar twice a day.  vary the time of day when you check, between before the 3 meals, and at bedtime.  also check if you have symptoms of your blood sugar being too high or too low.  please keep a record of the readings and bring it to your next appointment here (or you can bring the meter itself).  You can write it on any piece of paper.  please call us sooner if your blood sugar goes below 70, or if you have a lot of readings over 200.   Please increase the insulin to 60 units each morning.  I have sent a prescription to your pharmacy.    Please call or message [place] next week, to tell us how the blood sugar is doing.  Please come back for a follow-up appointment in 3 months.

## 2017-07-09 ENCOUNTER — Other Ambulatory Visit: Payer: Self-pay | Admitting: Endocrinology

## 2017-10-04 ENCOUNTER — Ambulatory Visit: Payer: BLUE CROSS/BLUE SHIELD | Admitting: Endocrinology

## 2017-10-25 ENCOUNTER — Encounter: Payer: BLUE CROSS/BLUE SHIELD | Admitting: Internal Medicine

## 2017-10-29 ENCOUNTER — Encounter: Payer: Self-pay | Admitting: Internal Medicine

## 2017-11-01 ENCOUNTER — Other Ambulatory Visit: Payer: Self-pay | Admitting: Endocrinology

## 2017-11-12 ENCOUNTER — Other Ambulatory Visit: Payer: Self-pay | Admitting: Internal Medicine

## 2017-11-22 ENCOUNTER — Other Ambulatory Visit: Payer: Self-pay

## 2017-11-22 MED ORDER — ATORVASTATIN CALCIUM 20 MG PO TABS: 20.0000 mg | ORAL_TABLET | Freq: Every day | ORAL | 0 refills | Status: AC

## 2017-12-13 ENCOUNTER — Other Ambulatory Visit: Payer: Self-pay | Admitting: Internal Medicine

## 2017-12-23 ENCOUNTER — Encounter: Payer: BLUE CROSS/BLUE SHIELD | Admitting: Internal Medicine

## 2017-12-23 ENCOUNTER — Telehealth: Payer: Self-pay | Admitting: *Deleted

## 2017-12-23 DIAGNOSIS — Z0289 Encounter for other administrative examinations: Secondary | ICD-10-CM

## 2017-12-23 NOTE — Telephone Encounter (Signed)
Pt scheduled for 12/31/17 at 3:40.

## 2017-12-23 NOTE — Telephone Encounter (Signed)
Okay to work in at 3:40 or 4pm slot.

## 2017-12-23 NOTE — Telephone Encounter (Signed)
Copied from [place] [phone]. Topic: Appointment Scheduling - Scheduling Inquiry for Clinic >> Dec 23, 2017 11:26 AM Conception Chancy, NT wrote: Reason for CRM: patient has canceled his appt for 12/23/17 it was a physical due to a conflict. Patient states he can only come after 3pm due to his work schedule and could not be at the office until atleast 3:20. Due to one note and no CPE after 3pm, please contact pt to reschedule.

## 2017-12-31 ENCOUNTER — Other Ambulatory Visit: Payer: Self-pay | Admitting: Endocrinology

## 2017-12-31 ENCOUNTER — Encounter: Payer: BLUE CROSS/BLUE SHIELD | Admitting: Internal Medicine

## 2017-12-31 DIAGNOSIS — Z0289 Encounter for other administrative examinations: Secondary | ICD-10-CM

## 2018-01-10 ENCOUNTER — Encounter: Payer: BLUE CROSS/BLUE SHIELD | Admitting: Internal Medicine

## 2018-01-13 ENCOUNTER — Encounter: Payer: Self-pay | Admitting: Internal Medicine

## 2018-01-20 ENCOUNTER — Other Ambulatory Visit: Payer: Self-pay | Admitting: Internal Medicine

## 2018-02-05 ENCOUNTER — Other Ambulatory Visit: Payer: Self-pay | Admitting: Endocrinology

## 2018-02-17 ENCOUNTER — Other Ambulatory Visit: Payer: Self-pay | Admitting: Internal Medicine

## 2018-03-19 ENCOUNTER — Encounter: Payer: BLUE CROSS/BLUE SHIELD | Admitting: Internal Medicine

## 2018-03-19 DIAGNOSIS — Z0289 Encounter for other administrative examinations: Secondary | ICD-10-CM

## 2018-03-21 ENCOUNTER — Other Ambulatory Visit: Payer: Self-pay | Admitting: Internal Medicine

## 2018-04-04 ENCOUNTER — Encounter: Payer: Self-pay | Admitting: Internal Medicine

## 2018-04-30 ENCOUNTER — Encounter: Payer: BLUE CROSS/BLUE SHIELD | Admitting: Internal Medicine

## 2018-04-30 ENCOUNTER — Telehealth: Payer: Self-pay

## 2018-04-30 DIAGNOSIS — Z0289 Encounter for other administrative examinations: Secondary | ICD-10-CM

## 2018-04-30 NOTE — Telephone Encounter (Signed)
Yes please

## 2018-04-30 NOTE — Telephone Encounter (Signed)
Multiple no shows/cancellations:   04/30/2018- cpe- no show 03/19/2018- cpe- cancel 12/31/2017- cpe- cancel 12/23/2017- cpe- cancel 10/25/2017- cpe- no show 09/12/2016- cpe- no show 04/20/2016- cpe- no show  Would you like to begin dismissal process?

## 2018-05-05 NOTE — Telephone Encounter (Signed)
Signed letter placed in outgoing mail.

## 2018-05-05 NOTE — Telephone Encounter (Signed)
Practice dismissal letter printed for pt., left at Dr. Ethel Rana desk to sign.

## 2018-05-05 NOTE — Telephone Encounter (Signed)
signed

## 2018-05-21 ENCOUNTER — Other Ambulatory Visit: Payer: Self-pay | Admitting: Endocrinology

## 2018-05-23 ENCOUNTER — Other Ambulatory Visit: Payer: Self-pay | Admitting: Internal Medicine

## 2018-05-23 NOTE — Telephone Encounter (Addendum)
Pt was dismissed from the office, only able to address urgent/emergent matters

## 2018-05-26 ENCOUNTER — Other Ambulatory Visit: Payer: Self-pay

## 2018-05-26 ENCOUNTER — Other Ambulatory Visit: Payer: Self-pay | Admitting: Endocrinology

## 2018-05-26 MED ORDER — LOSARTAN POTASSIUM-HCTZ 100-25 MG PO TABS: 1.0000 | ORAL_TABLET | Freq: Every day | ORAL | 0 refills | Status: DC

## 2018-05-27 NOTE — Telephone Encounter (Signed)
Pt has OV on 9.18.19 will refill then if appropriate/thx dmf

## 2018-05-28 ENCOUNTER — Ambulatory Visit (INDEPENDENT_AMBULATORY_CARE_PROVIDER_SITE_OTHER): Payer: BLUE CROSS/BLUE SHIELD | Admitting: Endocrinology

## 2018-05-28 ENCOUNTER — Encounter: Payer: Self-pay | Admitting: Endocrinology

## 2018-05-28 VITALS — BP 146/100 | HR 100 | Ht 73.0 in | Wt 282.2 lb

## 2018-05-28 DIAGNOSIS — E119 Type 2 diabetes mellitus without complications: Secondary | ICD-10-CM

## 2018-05-28 LAB — POCT GLYCOSYLATED HEMOGLOBIN (HGB A1C): Hemoglobin A1C: 9.6 % — AB (ref 4.0–5.6)

## 2018-05-28 MED ORDER — INSULIN GLARGINE 100 UNIT/ML SOLOSTAR PEN: 80.0000 [IU] | PEN_INJECTOR | SUBCUTANEOUS | 1 refills | Status: DC

## 2018-05-28 NOTE — Progress Notes (Signed)
Subjective:    Patient ID: Charles Yoder, male    DOB: 06-Jul-1971, 47 y.o.   MRN: [id number]  HPI Pt returns for f/u of diabetes mellitus: DM type: Insulin-requiring type 2 Dx'ed: 4259 Complications: none Therapy: insulin since 2017 DKA: never Severe hypoglycemia: never.   Pancreatitis: never Other: he declines multiple daily injections; he declines weight loss surgery. Interval history: no cbg record, but states cbg's still vary from 200-300.  There is no trend throughout the day.  pt states he feels well in general. Pt says he never misses the insulin.   Past Medical History:  Diagnosis Date  . Diabetes mellitus 2004   type 2  . Hyperlipidemia 05/16/2012  . Hypertension     Past Surgical History:  Procedure Laterality Date  . ELBOW FRACTURE SURGERY  as a child   left    Social History   Socioeconomic History  . Marital status: Married    Spouse name: Secondary school teacher  . Number of children: 2  . Years of education: Not on file  . Highest education level: Not on file  Occupational History  . Occupation: Licensed conveyancer: [employer]  . Financial resource strain: Not on file  . Food insecurity:    Worry: Not on file    Inability: Not on file  . Transportation needs:    Medical: Not on file    Non-medical: Not on file  Tobacco Use  . Smoking status: Never Smoker  . Smokeless tobacco: Former Systems developer    Types: Snuff  Substance and Sexual Activity  . Alcohol use: No    Alcohol/week: 0.0 standard drinks  . Drug use: No  . Sexual activity: Not on file  Lifestyle  . Physical activity:    Days per week: Not on file    Minutes per session: Not on file  . Stress: Not on file  Relationships  . Social connections:    Talks on phone: Not on file    Gets together: Not on file    Attends religious service: Not on file    Active member of club or organization: Not on file    Attends meetings of clubs or organizations: Not on file    Relationship  status: Not on file  . Intimate partner violence:    Fear of current or ex partner: Not on file    Emotionally abused: Not on file    Physically abused: Not on file    Forced sexual activity: Not on file  Other Topics Concern  . Not on file  Social History Narrative   Lives w/ wife and children   2 children 2000, and 2007        Current Outpatient Medications on File Prior to Visit  Medication Sig Dispense Refill  . aspirin 81 MG tablet Take 81 mg by mouth daily.    Marland Kitchen atorvastatin (LIPITOR) 20 MG tablet Take 1 tablet (20 mg total) by mouth daily. 30 tablet 0  . B-D ULTRAFINE III SHORT PEN 31G X 8 MM MISC USE ONCE A DAY 100 each 2  . glucose blood test strip 1 each by Other route 2 (two) times daily. And lancets 2/day 100 each 12  . losartan-hydrochlorothiazide (HYZAAR) 100-25 MG tablet Take 1 tablet by mouth daily. 30 tablet 0  . metFORMIN (GLUCOPHAGE) 1000 MG tablet TAKE 1 TABLET BY MOUTH TWICE A DAY WITH A MEAL 60 tablet 0  . [place]  Check blood sugar no more than twice daily. 100 each 12  . ONETOUCH VERIO test strip CHECK BLOOD SUGAR NO MORE THAN TWICE DAILY 200 each 3  . sildenafil (REVATIO) 20 MG tablet 1-5 pills, as needed for ED symptoms 30 tablet 11   No current facility-administered medications on file prior to visit.     No Known Allergies  Family History  Problem Relation Age of Onset  . Colon cancer Mother 32  . Diabetes Mother   . Diabetes Father   . Stroke Other        aunt  . CAD Other        GM, late onset  . Prostate cancer Neg Hx     BP (!) 146/100 (BP Location: Left Arm, Patient Position: Sitting)   Pulse 100   Ht 6\' 1"  (1.854 m)   Wt 282 lb 3.2 oz (128 kg)   SpO2 95%   BMI 37.23 kg/m    Review of Systems He denies hypoglycemia.      Objective:   Physical Exam VITAL SIGNS:  See vs page GENERAL: no distress Pulses: foot pulses are intact bilaterally.   MSK: no deformity of the feet or ankles.  CV: no edema of the  legs or ankles Skin:  no ulcer on the feet or ankles.  normal color and temp on the feet and ankles Neuro: sensation is intact to touch on the feet and ankles.    Lab Results  Component Value Date   HGBA1C 9.6 (A) 05/28/2018       Assessment & Plan:  HTN: is noted today Insulin-requiring type 2 DM: he needs increased rx.   Patient Instructions  Your blood pressure is high today.  Please see your primary care provider soon, to have it rechecked check your blood sugar twice a day.  vary the time of day when you check, between before the 3 meals, and at bedtime.  also check if you have symptoms of your blood sugar being too high or too low.  please keep a record of the readings and bring it to your next appointment here (or you can bring the meter itself).  You can write it on any piece of paper.  please call us sooner if your blood sugar goes below 70, or if you have a lot of readings over 200.   Please increase the insulin to 80 units each morning.  I have sent a prescription to your pharmacy.  Please come back for a follow-up appointment in 2 months.

## 2018-05-28 NOTE — Patient Instructions (Addendum)
Your blood pressure is high today.  Please see your primary care provider soon, to have it rechecked check your blood sugar twice a day.  vary the time of day when you check, between before the 3 meals, and at bedtime.  also check if you have symptoms of your blood sugar being too high or too low.  please keep a record of the readings and bring it to your next appointment here (or you can bring the meter itself).  You can write it on any piece of paper.  please call us sooner if your blood sugar goes below 70, or if you have a lot of readings over 200.   Please increase the insulin to 80 units each morning.  I have sent a prescription to your pharmacy.  Please come back for a follow-up appointment in 2 months.

## 2018-06-07 DIAGNOSIS — S20222A Contusion of left back wall of thorax, initial encounter: Secondary | ICD-10-CM | POA: Diagnosis not present

## 2018-06-07 DIAGNOSIS — I10 Essential (primary) hypertension: Secondary | ICD-10-CM | POA: Diagnosis not present

## 2018-06-13 ENCOUNTER — Encounter (HOSPITAL_COMMUNITY): Payer: Self-pay | Admitting: Nurse Practitioner

## 2018-06-13 DIAGNOSIS — E119 Type 2 diabetes mellitus without complications: Secondary | ICD-10-CM | POA: Diagnosis not present

## 2018-06-13 DIAGNOSIS — R600 Localized edema: Secondary | ICD-10-CM | POA: Insufficient documentation

## 2018-06-13 DIAGNOSIS — I10 Essential (primary) hypertension: Secondary | ICD-10-CM | POA: Insufficient documentation

## 2018-06-13 DIAGNOSIS — R2243 Localized swelling, mass and lump, lower limb, bilateral: Secondary | ICD-10-CM | POA: Diagnosis not present

## 2018-06-13 DIAGNOSIS — R7989 Other specified abnormal findings of blood chemistry: Secondary | ICD-10-CM | POA: Diagnosis not present

## 2018-06-13 DIAGNOSIS — Z794 Long term (current) use of insulin: Secondary | ICD-10-CM | POA: Diagnosis not present

## 2018-06-13 DIAGNOSIS — Z7982 Long term (current) use of aspirin: Secondary | ICD-10-CM | POA: Diagnosis not present

## 2018-06-13 DIAGNOSIS — R000 Tachycardia, unspecified: Secondary | ICD-10-CM | POA: Insufficient documentation

## 2018-06-13 DIAGNOSIS — Z79899 Other long term (current) drug therapy: Secondary | ICD-10-CM | POA: Insufficient documentation

## 2018-06-13 LAB — CBC
HEMATOCRIT: 45.2 % (ref 39.0–52.0)
HEMOGLOBIN: 15.7 g/dL (ref 13.0–17.0)
MCH: 30.9 pg (ref 26.0–34.0)
MCHC: 34.7 g/dL (ref 30.0–36.0)
MCV: 89 fL (ref 78.0–100.0)
Platelets: 221 10*3/uL (ref 150–400)
RBC: 5.08 MIL/uL (ref 4.22–5.81)
RDW: 12.6 % (ref 11.5–15.5)
WBC: 11.1 10*3/uL — ABNORMAL HIGH (ref 4.0–10.5)

## 2018-06-13 LAB — BASIC METABOLIC PANEL
ANION GAP: 13 (ref 5–15)
BUN: 21 mg/dL — ABNORMAL HIGH (ref 6–20)
CALCIUM: 9.5 mg/dL (ref 8.9–10.3)
CO2: 28 mmol/L (ref 22–32)
Chloride: 99 mmol/L (ref 98–111)
Creatinine, Ser: 1.64 mg/dL — ABNORMAL HIGH (ref 0.61–1.24)
GFR calc Af Amer: 56 mL/min — ABNORMAL LOW (ref >60)
GFR calc non Af Amer: 48 mL/min — ABNORMAL LOW (ref >60)
GLUCOSE: 275 mg/dL — AB (ref 70–99)
Potassium: 3.1 mmol/L — ABNORMAL LOW (ref 3.5–5.1)
Sodium: 140 mmol/L (ref 135–145)

## 2018-06-13 LAB — CBG MONITORING, ED: Glucose-Capillary: 281 mg/dL — ABNORMAL HIGH (ref 70–99)

## 2018-06-13 NOTE — ED Triage Notes (Signed)
Called pt from lobby x1 No response

## 2018-06-13 NOTE — ED Triage Notes (Signed)
Pt is c/o bilateral leg swelling that he states he noticed yesterday. Denies chest pain, shortness of breath or any other symptoms.

## 2018-06-14 ENCOUNTER — Emergency Department (HOSPITAL_COMMUNITY): Payer: BLUE CROSS/BLUE SHIELD

## 2018-06-14 ENCOUNTER — Emergency Department (HOSPITAL_COMMUNITY)
Admission: EM | Admit: 2018-06-14 | Discharge: 2018-06-14 | Disposition: A | Discharge status: Home / Self Care | Payer: BLUE CROSS/BLUE SHIELD | Attending: Emergency Medicine | Admitting: Emergency Medicine

## 2018-06-14 ENCOUNTER — Encounter (HOSPITAL_COMMUNITY): Payer: Self-pay

## 2018-06-14 DIAGNOSIS — I10 Essential (primary) hypertension: Secondary | ICD-10-CM

## 2018-06-14 DIAGNOSIS — R7989 Other specified abnormal findings of blood chemistry: Secondary | ICD-10-CM | POA: Diagnosis not present

## 2018-06-14 DIAGNOSIS — R609 Edema, unspecified: Secondary | ICD-10-CM

## 2018-06-14 LAB — BRAIN NATRIURETIC PEPTIDE: B NATRIURETIC PEPTIDE 5: 17.3 pg/mL (ref 0.0–100.0)

## 2018-06-14 LAB — I-STAT TROPONIN, ED: TROPONIN I, POC: 0 ng/mL (ref 0.00–0.08)

## 2018-06-14 LAB — D-DIMER, QUANTITATIVE (NOT AT ARMC): D DIMER QUANT: 1.5 ug{FEU}/mL — AB (ref 0.00–0.50)

## 2018-06-14 MED ORDER — IOPAMIDOL (ISOVUE-370) INJECTION 76%
INTRAVENOUS | Status: AC
  Filled 2018-06-14: qty 100

## 2018-06-14 MED ORDER — IOPAMIDOL (ISOVUE-370) INJECTION 76%
100.0000 mL | Freq: Once | INTRAVENOUS | Status: AC | PRN
  Administered 2018-06-14: 100 mL via INTRAVENOUS

## 2018-06-14 MED ORDER — SODIUM CHLORIDE 0.9 % IJ SOLN
INTRAMUSCULAR | Status: AC
  Filled 2018-06-14: qty 50

## 2018-06-14 NOTE — Discharge Instructions (Addendum)
Continue taking your prescribed blood pressure medication.  Try to avoid consumption of processed foods, canned foods, fast food, and salt as these can significantly affect your blood pressure.  Drink plenty of water throughout the day.  Also, continue your insulin as prescribed.  Have your blood sugar and your blood pressure rechecked by a primary care doctor.  You should call to make an appointment to be seen within the week.  Reference the resource guide attached for primary care services if you are no longer able to follow-up with Dr. Larose Kells.  We recommend the use of compression stockings for lower extremity swelling.  These can be purchased at your local pharmacy.

## 2018-06-14 NOTE — ED Provider Notes (Signed)
[facility] DEPT Provider Note   CSN: [id number] Arrival date & time: 06/13/18  2147     History   Chief Complaint Chief Complaint  Patient presents with  . Leg Swelling    Bilateral    HPI JEHIEL KOEPP is a 47 y.o. male.   47 year old male history of diabetes, hypertension, dyslipidemia presents to the emergency department for evaluation of bilateral lower extremity swelling.  Notes onset of symptoms yesterday.  States that swelling in his bilateral feet and ankles have persisted.  He is unsure if it improves with prolonged elevation of his lower extremities.  States that he is on his feet a lot at work.  Has recently increased his consumption of processed foods.  Notes elevated blood pressure recently as well.  States that he has been compliant with his Hyzaar.  Denies any chest pain, shortness of breath, orthopnea, dyspnea on exertion, fevers, nausea, vomiting.  No personal or family history of DVT or PE.     Past Medical History:  Diagnosis Date  . Diabetes mellitus 2004   type 2  . Hyperlipidemia 05/16/2012  . Hypertension     Patient Active Problem List   Diagnosis Date Noted  . PCP NOTES >>>>>>>>>>>>>>>>>>>>>. 10/21/2016  . Erectile dysfunction 02/22/2016  . Hyperlipidemia 05/16/2012  . Annual physical exam 11/09/2011  . DIZZINESS 06/09/2010  . EXOPHTHALMOS 01/05/2009  . DM II (diabetes mellitus, type II), controlled ([facility]) 04/23/2008  . Essential hypertension 04/23/2008    Past Surgical History:  Procedure Laterality Date  . ELBOW FRACTURE SURGERY  as a child   left        Home Medications    Prior to Admission medications   Medication Sig Start Date End Date Taking? Authorizing Provider  aspirin 81 MG tablet Take 81 mg by mouth daily.   Yes [provider]  ibuprofen (ADVIL,MOTRIN) 200 MG tablet Take 800 mg by mouth every 6 (six) hours as needed for moderate pain.   Yes [provider]  Insulin  Glargine (LANTUS SOLOSTAR) 100 UNIT/ML Solostar Pen Inject 80 Units into the skin every morning. 05/28/18  Yes Renato Shin, MD  losartan-hydrochlorothiazide (HYZAAR) 100-25 MG tablet Take 1 tablet by mouth daily. 05/26/18  Yes Paz, Alda Berthold, MD  atorvastatin (LIPITOR) 20 MG tablet Take 1 tablet (20 mg total) by mouth daily. Patient not taking: Reported on 06/14/2018 11/22/17   Renato Shin, MD  B-D ULTRAFINE III SHORT PEN 31G X 8 MM MISC USE ONCE A DAY 04/10/17   Renato Shin, MD  glucose blood test strip 1 each by Other route 2 (two) times daily. And lancets 2/day 02/22/16   Renato Shin, MD  metFORMIN (GLUCOPHAGE) 1000 MG tablet TAKE 1 TABLET BY MOUTH TWICE A DAY WITH A MEAL Patient not taking: Reported on 06/14/2018 07/09/17   Renato Shin, MD  [facility] DELICA LANCETS FINE MISC Check blood sugar no more than twice daily. 04/25/15   Colon Branch, MD  [facility] VERIO test strip CHECK BLOOD SUGAR NO MORE THAN TWICE DAILY 02/06/18   Renato Shin, MD  sildenafil (REVATIO) 20 MG tablet 1-5 pills, as needed for ED symptoms 03/23/16   Renato Shin, MD    Family History Family History  Problem Relation Age of Onset  . Colon cancer Mother 46  . Diabetes Mother   . Diabetes Father   . Stroke Other        aunt  . CAD Other  GM, late onset  . Prostate cancer Neg Hx     Social History Social History   Tobacco Use  . Smoking status: Never Smoker  . Smokeless tobacco: Former Systems developer    Types: Snuff  Substance Use Topics  . Alcohol use: No    Alcohol/week: 0.0 standard drinks  . Drug use: No     Allergies   Patient has no known allergies.   Review of Systems Review of Systems Ten systems reviewed and are negative for acute change, except as noted in the HPI.    Physical Exam Updated Vital Signs BP 139/88 (BP Location: Left Arm)   Pulse (!) 110   Temp 98.1 F (36.7 C) (Oral)   Resp 18   SpO2 96%   Physical Exam  Constitutional: He is oriented to person, place, and time. He  appears well-developed and well-nourished. No distress.  Nontoxic appearing and in NAD  HENT:  Head: Normocephalic and atraumatic.  Eyes: Conjunctivae and EOM are normal. No scleral icterus.  Neck: Normal range of motion.  Cardiovascular: Normal rate, regular rhythm and intact distal pulses.  Pulmonary/Chest: Effort normal. No stridor. No respiratory distress. He has no wheezes.  Respirations even and unlabored  Musculoskeletal: Normal range of motion.  1+ pitting edema in BLE  Neurological: He is alert and oriented to person, place, and time. He exhibits normal muscle tone. Coordination normal.  GCS 15. Moving all extremities spontaneously.  Skin: Skin is warm and dry. No rash noted. He is not diaphoretic. No erythema. No pallor.  Psychiatric: He has a normal mood and affect. His behavior is normal.  Nursing note and vitals reviewed.    ED Treatments / Results  Labs (all labs ordered are listed, but only abnormal results are displayed) Labs Reviewed  BASIC METABOLIC PANEL - Abnormal; Notable for the following components:      Result Value   Potassium 3.1 (*)    Glucose, Bld 275 (*)    BUN 21 (*)    Creatinine, Ser 1.64 (*)    GFR calc non Af Amer 48 (*)    GFR calc Af Amer 56 (*)    All other components within normal limits  CBC - Abnormal; Notable for the following components:   WBC 11.1 (*)    All other components within normal limits  D-DIMER, QUANTITATIVE (NOT AT [facility]) - Abnormal; Notable for the following components:   D-Dimer, Quant 1.50 (*)    All other components within normal limits  CBG MONITORING, ED - Abnormal; Notable for the following components:   Glucose-Capillary 281 (*)    All other components within normal limits  BRAIN NATRIURETIC PEPTIDE  I-STAT TROPONIN, ED    EKG EKG Interpretation  Date/Time:  Saturday June 14 2018 02:39:10 EDT Ventricular Rate:  104 PR Interval:    QRS Duration: 99 QT Interval:  372 QTC Calculation: 490 R  Axis:   14 Text Interpretation:  Sinus tachycardia Multiple ventricular premature complexes Abnormal R-wave progression, early transition Borderline T abnormalities, anterior leads Borderline prolonged QT interval When compared with ECG of 12/07/2015, Premature ventricular complexes are now present QT has shortened Confirmed by Delora Fuel ([id number]) on 06/14/2018 2:44:00 AM   Radiology Ct Angio Chest Pe W And/or Wo Contrast  Result Date: 06/14/2018 CLINICAL DATA:  PE suspected, intermediate prob, positive D-dimer. Bilateral leg swelling. EXAM: CT ANGIOGRAPHY CHEST WITH CONTRAST TECHNIQUE: Multidetector CT imaging of the chest was performed using the standard protocol during bolus administration of intravenous contrast. Multiplanar  CT image reconstructions and MIPs were obtained to evaluate the vascular anatomy. CONTRAST:  14mL ISOVUE-370 IOPAMIDOL (ISOVUE-370) INJECTION 76% COMPARISON:  Chest radiograph 12/07/2015. No recent exams available. FINDINGS: Cardiovascular: Examination technically limited by soft tissue attenuation from habitus and contrast bolus timing. There are no filling defects within the pulmonary arteries to the lobar level suggest pulmonary embolus. No aortic dissection or aneurysm. Normal heart size. No pericardial effusion. Mediastinum/Nodes: No enlarged mediastinal or hilar lymph nodes. The esophagus is decompressed. No thyroid nodule. Lungs/Pleura: Low lung volumes with bilateral lower lobe, right middle lobe and lingular consolidations, with air bronchograms. No evidence of pulmonary edema. No pleural fluid. No pulmonary mass. Upper Abdomen: No acute finding. Musculoskeletal: Well-defined 2.2cm round subcutaneous low-density in the left axilla/anterolateral chest wall is most consistent with sebaceous cyst. There are no acute or suspicious osseous abnormalities. Review of the MIP images confirms the above findings. IMPRESSION: 1. No central pulmonary embolus. Evaluation of the segmental  and subsegmental branches is limited by contrast bolus timing and soft tissue attenuation from habitus. 2. Low lung volumes with bilateral lower lobe, right middle lobe and lingular consolidations with air bronchograms. Findings may represent atelectasis or pneumonia. Electronically Signed   By: Keith Rake M.D.   On: 06/14/2018 04:58    Procedures Procedures (including critical care time)  Medications Ordered in ED Medications  sodium chloride 0.9 % injection (has no administration in time range)  iopamidol (ISOVUE-370) 76 % injection (has no administration in time range)  iopamidol (ISOVUE-370) 76 % injection 100 mL (100 mLs Intravenous Contrast Given 06/14/18 0434)     Initial Impression / Assessment and Plan / ED Course  I have reviewed the triage vital signs and the nursing notes.  Pertinent labs & imaging results that were available during my care of the patient were reviewed by me and considered in my medical decision making (see chart for details).     47 year old male presents to the emergency department for evaluation of bilateral lower extremity swelling.  Reports onset of symptoms yesterday.  No history of trauma or fall.  His physical exam is consistent with peripheral edema.  There is no evidence of secondary infection or cellulitis.  The patient denies any chest pain or shortness of breath.  No present concern for heart failure, especially given reassuring BNP.  Troponin negative.  The patient was consistently tachycardic while in the ED.  On chart review, the patient has been tachycardic since evaluations dating back to 2017.  A d-dimer was ordered to evaluate for possible pulmonary embolus.  Subsequent CT scan negative for evidence of acute PE.  Changes in the bilateral lower lobes favored to represent atelectasis over pneumonia given absence of infectious symptoms.  Have counseled the patient on fluid hydration as well as restricting sodium in his diet.  He has also  been recommended to use compression stockings to prevent persistent or worsening edema.  Encouraged primary care follow-up to ensure symptomatic improvement.  Return precautions discussed and provided.  Patient discharged in stable condition with no unaddressed concerns.   Vitals:   06/13/18 2224 06/14/18 0244 06/14/18 0528  BP: (!) 165/104 (!) 163/104 139/88  Pulse: (!) 114 (!) 103 (!) 110  Resp: 20 20 18   Temp: 98.4 F (36.9 C) 98.3 F (36.8 C) 98.1 F (36.7 C)  TempSrc: Oral  Oral  SpO2: 93% 93% 96%    Final Clinical Impressions(s) / ED Diagnoses   Final diagnoses:  Peripheral edema  Hypertension not at goal  ED Discharge Orders    None       Antonietta Breach, Hershal Coria 89/48/34 [id number]    Delora Fuel, MD 07/46/00 2231

## 2018-06-18 ENCOUNTER — Other Ambulatory Visit: Payer: Self-pay | Admitting: Internal Medicine

## 2018-07-17 DIAGNOSIS — E1169 Type 2 diabetes mellitus with other specified complication: Secondary | ICD-10-CM | POA: Diagnosis not present

## 2018-07-17 DIAGNOSIS — Z800 Family history of malignant neoplasm of digestive organs: Secondary | ICD-10-CM | POA: Diagnosis not present

## 2018-07-17 DIAGNOSIS — Z794 Long term (current) use of insulin: Secondary | ICD-10-CM | POA: Diagnosis not present

## 2018-07-17 DIAGNOSIS — I10 Essential (primary) hypertension: Secondary | ICD-10-CM | POA: Diagnosis not present

## 2018-07-30 ENCOUNTER — Ambulatory Visit (INDEPENDENT_AMBULATORY_CARE_PROVIDER_SITE_OTHER): Payer: BLUE CROSS/BLUE SHIELD | Admitting: Endocrinology

## 2018-07-30 ENCOUNTER — Encounter: Payer: Self-pay | Admitting: Endocrinology

## 2018-07-30 VITALS — BP 164/88 | HR 107 | Ht 73.0 in | Wt 282.0 lb

## 2018-07-30 DIAGNOSIS — E119 Type 2 diabetes mellitus without complications: Secondary | ICD-10-CM | POA: Diagnosis not present

## 2018-07-30 LAB — POCT GLYCOSYLATED HEMOGLOBIN (HGB A1C): HEMOGLOBIN A1C: 8.2 % — AB (ref 4.0–5.6)

## 2018-07-30 MED ORDER — INSULIN GLARGINE 100 UNIT/ML SOLOSTAR PEN: 90.0000 [IU] | PEN_INJECTOR | SUBCUTANEOUS | 1 refills | Status: DC

## 2018-07-30 NOTE — Patient Instructions (Addendum)
Your blood pressure is high today.  Please see your primary care provider soon, to have it rechecked check your blood sugar twice a day.  vary the time of day when you check, between before the 3 meals, and at bedtime.  also check if you have symptoms of your blood sugar being too high or too low.  please keep a record of the readings and bring it to your next appointment here (or you can bring the meter itself).  You can write it on any piece of paper.  please call us sooner if your blood sugar goes below 70, or if you have a lot of readings over 200.   Please increase the insulin to 90 units each morning.   On this type of insulin schedule, you should eat meals on a regular schedule.  If a meal is missed or significantly delayed, your blood sugar could go low.  Please come back for a follow-up appointment in 2 months.

## 2018-07-30 NOTE — Progress Notes (Signed)
Subjective:    Patient ID: Charles Yoder, male    DOB: 1971/02/23, 47 y.o.   MRN: [id number]  HPI Pt returns for f/u of diabetes mellitus: DM type: Insulin-requiring type 2 Dx'ed: 5361 Complications: none Therapy: insulin since 2017 DKA: never Severe hypoglycemia: never.   Pancreatitis: never Other: he declines multiple daily injections; he declines weight loss surgery.  He works M-F, but says cbg's are similar on work days vs days off.   Interval history: no cbg record, but states cbg's still vary from 150-200.  There is no trend throughout the day.   pt states he feels well in general. Pt says he never misses the insulin.  Past Medical History:  Diagnosis Date  . Diabetes mellitus 2004   type 2  . Hyperlipidemia 05/16/2012  . Hypertension     Past Surgical History:  Procedure Laterality Date  . ELBOW FRACTURE SURGERY  as a child   left    Social History   Socioeconomic History  . Marital status: Married    Spouse name: Secondary school teacher  . Number of children: 2  . Years of education: Not on file  . Highest education level: Not on file  Occupational History  . Occupation: Licensed conveyancer: [employer]  . Financial resource strain: Not on file  . Food insecurity:    Worry: Not on file    Inability: Not on file  . Transportation needs:    Medical: Not on file    Non-medical: Not on file  Tobacco Use  . Smoking status: Never Smoker  . Smokeless tobacco: Former Systems developer    Types: Snuff  Substance and Sexual Activity  . Alcohol use: No    Alcohol/week: 0.0 standard drinks  . Drug use: No  . Sexual activity: Not on file  Lifestyle  . Physical activity:    Days per week: Not on file    Minutes per session: Not on file  . Stress: Not on file  Relationships  . Social connections:    Talks on phone: Not on file    Gets together: Not on file    Attends religious service: Not on file    Active member of club or organization: Not on file   Attends meetings of clubs or organizations: Not on file    Relationship status: Not on file  . Intimate partner violence:    Fear of current or ex partner: Not on file    Emotionally abused: Not on file    Physically abused: Not on file    Forced sexual activity: Not on file  Other Topics Concern  . Not on file  Social History Narrative   Lives w/ wife and children   2 children 2000, and 2007        Current Outpatient Medications on File Prior to Visit  Medication Sig Dispense Refill  . aspirin 81 MG tablet Take 81 mg by mouth daily.    Marland Kitchen atorvastatin (LIPITOR) 20 MG tablet Take 1 tablet (20 mg total) by mouth daily. 30 tablet 0  . B-D ULTRAFINE III SHORT PEN 31G X 8 MM MISC USE ONCE A DAY 100 each 2  . glucose blood test strip 1 each by Other route 2 (two) times daily. And lancets 2/day 100 each 12  . ibuprofen (ADVIL,MOTRIN) 200 MG tablet Take 800 mg by mouth every 6 (six) hours as needed for moderate pain.    Marland Kitchen losartan-hydrochlorothiazide (HYZAAR) 100-25 MG  tablet Take 1 tablet by mouth daily. 15 tablet 0  . ONETOUCH DELICA LANCETS FINE MISC Check blood sugar no more than twice daily. 100 each 12  . ONETOUCH VERIO test strip CHECK BLOOD SUGAR NO MORE THAN TWICE DAILY 200 each 3  . sildenafil (REVATIO) 20 MG tablet 1-5 pills, as needed for ED symptoms 30 tablet 11   No current facility-administered medications on file prior to visit.     No Known Allergies  Family History  Problem Relation Age of Onset  . Colon cancer Mother 41  . Diabetes Mother   . Diabetes Father   . Stroke Other        aunt  . CAD Other        GM, late onset  . Prostate cancer Neg Hx     BP (!) 164/88 (BP Location: Right Arm, Patient Position: Sitting, Cuff Size: Large)   Pulse (!) 107   Ht 6\' 1"  (1.854 m)   Wt 282 lb (127.9 kg)   SpO2 95%   BMI 37.21 kg/m    Review of Systems He denies hypoglycemia.      Objective:   Physical Exam VITAL SIGNS:  See vs page GENERAL: no  distress Pulses: foot pulses are intact bilaterally.   MSK: no deformity of the feet or ankles.  CV: no edema of the legs or ankles Skin:  no ulcer on the feet or ankles.  normal color and temp on the feet and ankles.   Neuro: sensation is intact to touch on the feet and ankles.    Lab Results  Component Value Date   HGBA1C 8.2 (A) 07/30/2018       Assessment & Plan:  Insulin-requiring type 2 DM: he needs increased rx Occupational status.  We discussed again.  Pt again says he does not need to adjust insulin for this. HTN: is noted today  Patient Instructions  Your blood pressure is high today.  Please see your primary care provider soon, to have it rechecked check your blood sugar twice a day.  vary the time of day when you check, between before the 3 meals, and at bedtime.  also check if you have symptoms of your blood sugar being too high or too low.  please keep a record of the readings and bring it to your next appointment here (or you can bring the meter itself).  You can write it on any piece of paper.  please call us sooner if your blood sugar goes below 70, or if you have a lot of readings over 200.   Please increase the insulin to 90 units each morning.   On this type of insulin schedule, you should eat meals on a regular schedule.  If a meal is missed or significantly delayed, your blood sugar could go low.  Please come back for a follow-up appointment in 2 months.

## 2018-08-11 ENCOUNTER — Other Ambulatory Visit: Payer: Self-pay | Admitting: Endocrinology

## 2018-08-11 DIAGNOSIS — Z794 Long term (current) use of insulin: Secondary | ICD-10-CM | POA: Diagnosis not present

## 2018-08-11 DIAGNOSIS — Z6835 Body mass index (BMI) 35.0-35.9, adult: Secondary | ICD-10-CM | POA: Diagnosis not present

## 2018-08-11 DIAGNOSIS — I10 Essential (primary) hypertension: Secondary | ICD-10-CM | POA: Diagnosis not present

## 2018-08-11 DIAGNOSIS — E1169 Type 2 diabetes mellitus with other specified complication: Secondary | ICD-10-CM | POA: Diagnosis not present

## 2018-09-08 DIAGNOSIS — Z6835 Body mass index (BMI) 35.0-35.9, adult: Secondary | ICD-10-CM | POA: Diagnosis not present

## 2018-09-08 DIAGNOSIS — Z23 Encounter for immunization: Secondary | ICD-10-CM | POA: Diagnosis not present

## 2018-09-08 DIAGNOSIS — I10 Essential (primary) hypertension: Secondary | ICD-10-CM | POA: Diagnosis not present

## 2018-10-03 ENCOUNTER — Other Ambulatory Visit: Payer: Self-pay

## 2018-10-03 ENCOUNTER — Telehealth: Payer: Self-pay | Admitting: Endocrinology

## 2018-10-03 MED ORDER — INSULIN GLARGINE 100 UNIT/ML SOLOSTAR PEN: 90.0000 [IU] | PEN_INJECTOR | SUBCUTANEOUS | 1 refills | Status: DC

## 2018-10-03 NOTE — Telephone Encounter (Signed)
rf sent 

## 2018-10-03 NOTE — Telephone Encounter (Signed)
MEDICATION: Insulin Glargine (LANTUS SOLOSTAR) 100 UNIT/ML Solostar Pen [id number] - [place], [place] - [address] PHARMACY:    IS THIS A 90 DAY SUPPLY :   IS PATIENT OUT OF MEDICATION: Yes  IF NOT; HOW MUCH IS LEFT:   LAST APPOINTMENT DATE: @12 /10/2017  NEXT APPOINTMENT DATE:@1 /27/2020  DO WE HAVE YOUR PERMISSION TO LEAVE A DETAILED MESSAGE:  OTHER COMMENTS:    **Let patient know to contact pharmacy at the end of the day to make sure medication is ready. **  ** Please notify patient to allow 48-72 hours to process**  **Encourage patient to contact the pharmacy for refills or they can request refills through [facility]**

## 2018-10-06 ENCOUNTER — Ambulatory Visit (INDEPENDENT_AMBULATORY_CARE_PROVIDER_SITE_OTHER): Payer: BLUE CROSS/BLUE SHIELD | Admitting: Endocrinology

## 2018-10-06 ENCOUNTER — Encounter: Payer: Self-pay | Admitting: Endocrinology

## 2018-10-06 VITALS — BP 124/70 | HR 112 | Ht 73.0 in | Wt 291.4 lb

## 2018-10-06 DIAGNOSIS — E119 Type 2 diabetes mellitus without complications: Secondary | ICD-10-CM

## 2018-10-06 LAB — POCT GLYCOSYLATED HEMOGLOBIN (HGB A1C): Hemoglobin A1C: 9.8 % — AB (ref 4.0–5.6)

## 2018-10-06 NOTE — Progress Notes (Signed)
Subjective:    Patient ID: Charles Yoder, male    DOB: 03/12/1971, 48 y.o.   MRN: [id number]  HPI Pt returns for f/u of diabetes mellitus: DM type: Insulin-requiring type 2 Dx'ed: 1740 Complications: none Therapy: insulin since 2017 DKA: never Severe hypoglycemia: never.   Pancreatitis: never Other: he declines multiple daily injections; he declines weight loss surgery.  He works M-F, but says cbg's are similar on work days vs days off.   Interval history: no cbg record, but states cbg's still vary from 105-145.  There is no trend throughout the day.  pt states he feels well in general. Pt says he never misses the insulin.   Past Medical History:  Diagnosis Date  . Diabetes mellitus 2004   type 2  . Hyperlipidemia 05/16/2012  . Hypertension     Past Surgical History:  Procedure Laterality Date  . ELBOW FRACTURE SURGERY  as a child   left    Social History   Socioeconomic History  . Marital status: Married    Spouse name: Secondary school teacher  . Number of children: 2  . Years of education: Not on file  . Highest education level: Not on file  Occupational History  . Occupation: Licensed conveyancer: [employer]  . Financial resource strain: Not on file  . Food insecurity:    Worry: Not on file    Inability: Not on file  . Transportation needs:    Medical: Not on file    Non-medical: Not on file  Tobacco Use  . Smoking status: Never Smoker  . Smokeless tobacco: Former Systems developer    Types: Snuff  Substance and Sexual Activity  . Alcohol use: No    Alcohol/week: 0.0 standard drinks  . Drug use: No  . Sexual activity: Not on file  Lifestyle  . Physical activity:    Days per week: Not on file    Minutes per session: Not on file  . Stress: Not on file  Relationships  . Social connections:    Talks on phone: Not on file    Gets together: Not on file    Attends religious service: Not on file    Active member of club or organization: Not on file   Attends meetings of clubs or organizations: Not on file    Relationship status: Not on file  . Intimate partner violence:    Fear of current or ex partner: Not on file    Emotionally abused: Not on file    Physically abused: Not on file    Forced sexual activity: Not on file  Other Topics Concern  . Not on file  Social History Narrative   Lives w/ wife and children   2 children 2000, and 2007        Current Outpatient Medications on File Prior to Visit  Medication Sig Dispense Refill  . aspirin 81 MG tablet Take 81 mg by mouth daily.    Marland Kitchen atorvastatin (LIPITOR) 20 MG tablet Take 1 tablet (20 mg total) by mouth daily. 30 tablet 0  . B-D ULTRAFINE III SHORT PEN 31G X 8 MM MISC USE ONCE A DAY 100 each 2  . glucose blood test strip 1 each by Other route 2 (two) times daily. And lancets 2/day 100 each 12  . ibuprofen (ADVIL,MOTRIN) 200 MG tablet Take 800 mg by mouth every 6 (six) hours as needed for moderate pain.    . Insulin Glargine (LANTUS SOLOSTAR)  100 UNIT/ML Solostar Pen Inject 90 Units into the skin every morning. 10 pen 1  . losartan-hydrochlorothiazide (HYZAAR) 100-25 MG tablet Take 1 tablet by mouth daily. 15 tablet 0  . ONETOUCH DELICA LANCETS FINE MISC Check blood sugar no more than twice daily. 100 each 12  . ONETOUCH VERIO test strip CHECK BLOOD SUGAR NO MORE THAN TWICE DAILY 200 each 3  . sildenafil (REVATIO) 20 MG tablet 1-5 pills, as needed for ED symptoms 30 tablet 11   No current facility-administered medications on file prior to visit.     No Known Allergies  Family History  Problem Relation Age of Onset  . Colon cancer Mother 66  . Diabetes Mother   . Diabetes Father   . Stroke Other        aunt  . CAD Other        GM, late onset  . Prostate cancer Neg Hx     BP 124/70 (BP Location: Left Arm, Patient Position: Sitting, Cuff Size: Large)   Pulse (!) 112   Ht 6\' 1"  (1.854 m)   Wt 291 lb 6.4 oz (132.2 kg)   SpO2 94%   BMI 38.45 kg/m    Review of  Systems He denies hypoglycemia    Objective:   Physical Exam VITAL SIGNS:  See vs page GENERAL: no distress Pulses: dorsalis pedis intact bilat.   MSK: no deformity of the feet CV: 1+ bilat leg edema Skin:  no ulcer on the feet.  normal color and temp on the feet. Neuro: sensation is intact to touch on the feet  Lab Results  Component Value Date   HGBA1C 9.8 (A) 10/06/2018       Assessment & Plan:  Insulin-requiring type 2 DM: worse Lab abnormality: there is a discordance between cbg's and a1c.  Edema: this limits rx options.   Patient Instructions  check your blood sugar twice a day.  vary the time of day when you check, between before the 3 meals, and at bedtime.  also check if you have symptoms of your blood sugar being too high or too low.  please keep a record of the readings and bring it to your next appointment here (or you can bring the meter itself).  You can write it on any piece of paper.  please call us sooner if your blood sugar goes below 70, or if you have a lot of readings over 200.   A different type of diabetes blood test is requested for you today.  We'll let you know about the results.  On this type of insulin schedule, you should eat meals on a regular schedule.  If a meal is missed or significantly delayed, your blood sugar could go low.  Please come back for a follow-up appointment in 2 months.

## 2018-10-06 NOTE — Patient Instructions (Addendum)
check your blood sugar twice a day.  vary the time of day when you check, between before the 3 meals, and at bedtime.  also check if you have symptoms of your blood sugar being too high or too low.  please keep a record of the readings and bring it to your next appointment here (or you can bring the meter itself).  You can write it on any piece of paper.  please call us sooner if your blood sugar goes below 70, or if you have a lot of readings over 200.   A different type of diabetes blood test is requested for you today.  We'll let you know about the results.  On this type of insulin schedule, you should eat meals on a regular schedule.  If a meal is missed or significantly delayed, your blood sugar could go low.  Please come back for a follow-up appointment in 2 months.

## 2018-10-07 LAB — FRUCTOSAMINE: FRUCTOSAMINE: 383 umol/L — AB (ref 205–285)

## 2018-10-09 ENCOUNTER — Other Ambulatory Visit: Payer: Self-pay

## 2018-10-09 MED ORDER — INSULIN GLARGINE 100 UNIT/ML SOLOSTAR PEN: 100.0000 [IU] | PEN_INJECTOR | SUBCUTANEOUS | 1 refills | Status: DC

## 2018-10-17 ENCOUNTER — Telehealth: Payer: Self-pay | Admitting: Endocrinology

## 2018-10-17 NOTE — Telephone Encounter (Signed)
[facility]ulin Glargine (LANTUS SOLOSTAR) 100 UNIT/ML Solostar Pen [id number] - [place], [place] - [address] Please Advise, thanks

## 2018-10-20 ENCOUNTER — Other Ambulatory Visit: Payer: Self-pay

## 2018-10-20 MED ORDER — INSULIN GLARGINE 100 UNIT/ML SOLOSTAR PEN: 100.0000 [IU] | PEN_INJECTOR | SUBCUTANEOUS | 1 refills | Status: DC

## 2018-10-20 NOTE — Telephone Encounter (Signed)
Rx sent to pharmacy listed below.

## 2018-12-01 ENCOUNTER — Other Ambulatory Visit: Payer: Self-pay | Admitting: Endocrinology

## 2018-12-09 DIAGNOSIS — J069 Acute upper respiratory infection, unspecified: Secondary | ICD-10-CM | POA: Diagnosis not present

## 2018-12-10 ENCOUNTER — Inpatient Hospital Stay (HOSPITAL_COMMUNITY)
Admission: EM | Admit: 2018-12-10 | Discharge: 2019-01-09 | DRG: 207 | Disposition: E | Payer: BLUE CROSS/BLUE SHIELD | Attending: Pulmonary Disease | Admitting: Pulmonary Disease

## 2018-12-10 DIAGNOSIS — R05 Cough: Secondary | ICD-10-CM | POA: Diagnosis not present

## 2018-12-10 DIAGNOSIS — D696 Thrombocytopenia, unspecified: Secondary | ICD-10-CM | POA: Diagnosis not present

## 2018-12-10 DIAGNOSIS — Z6841 Body Mass Index (BMI) 40.0 and over, adult: Secondary | ICD-10-CM

## 2018-12-10 DIAGNOSIS — R55 Syncope and collapse: Secondary | ICD-10-CM | POA: Diagnosis present

## 2018-12-10 DIAGNOSIS — N179 Acute kidney failure, unspecified: Secondary | ICD-10-CM | POA: Diagnosis present

## 2018-12-10 DIAGNOSIS — R6520 Severe sepsis without septic shock: Secondary | ICD-10-CM

## 2018-12-10 DIAGNOSIS — J1289 Other viral pneumonia: Secondary | ICD-10-CM | POA: Diagnosis present

## 2018-12-10 DIAGNOSIS — Z794 Long term (current) use of insulin: Secondary | ICD-10-CM

## 2018-12-10 DIAGNOSIS — J80 Acute respiratory distress syndrome: Secondary | ICD-10-CM | POA: Diagnosis not present

## 2018-12-10 DIAGNOSIS — A4189 Other specified sepsis: Secondary | ICD-10-CM | POA: Diagnosis present

## 2018-12-10 DIAGNOSIS — I4891 Unspecified atrial fibrillation: Secondary | ICD-10-CM | POA: Diagnosis not present

## 2018-12-10 DIAGNOSIS — Z20822 Contact with and (suspected) exposure to covid-19: Secondary | ICD-10-CM | POA: Diagnosis present

## 2018-12-10 DIAGNOSIS — T8241XA Breakdown (mechanical) of vascular dialysis catheter, initial encounter: Secondary | ICD-10-CM

## 2018-12-10 DIAGNOSIS — E872 Acidosis: Secondary | ICD-10-CM | POA: Diagnosis present

## 2018-12-10 DIAGNOSIS — E119 Type 2 diabetes mellitus without complications: Secondary | ICD-10-CM

## 2018-12-10 DIAGNOSIS — R918 Other nonspecific abnormal finding of lung field: Secondary | ICD-10-CM | POA: Diagnosis not present

## 2018-12-10 DIAGNOSIS — R579 Shock, unspecified: Secondary | ICD-10-CM | POA: Diagnosis not present

## 2018-12-10 DIAGNOSIS — G839 Paralytic syndrome, unspecified: Secondary | ICD-10-CM | POA: Diagnosis present

## 2018-12-10 DIAGNOSIS — I10 Essential (primary) hypertension: Secondary | ICD-10-CM | POA: Diagnosis not present

## 2018-12-10 DIAGNOSIS — Z4659 Encounter for fitting and adjustment of other gastrointestinal appliance and device: Secondary | ICD-10-CM

## 2018-12-10 DIAGNOSIS — R402252 Coma scale, best verbal response, oriented, at arrival to emergency department: Secondary | ICD-10-CM | POA: Diagnosis present

## 2018-12-10 DIAGNOSIS — Z79899 Other long term (current) drug therapy: Secondary | ICD-10-CM

## 2018-12-10 DIAGNOSIS — E8770 Fluid overload, unspecified: Secondary | ICD-10-CM | POA: Diagnosis not present

## 2018-12-10 DIAGNOSIS — I129 Hypertensive chronic kidney disease with stage 1 through stage 4 chronic kidney disease, or unspecified chronic kidney disease: Secondary | ICD-10-CM | POA: Diagnosis not present

## 2018-12-10 DIAGNOSIS — F410 Panic disorder [episodic paroxysmal anxiety] without agoraphobia: Secondary | ICD-10-CM | POA: Diagnosis present

## 2018-12-10 DIAGNOSIS — R6521 Severe sepsis with septic shock: Secondary | ICD-10-CM | POA: Diagnosis not present

## 2018-12-10 DIAGNOSIS — E11649 Type 2 diabetes mellitus with hypoglycemia without coma: Secondary | ICD-10-CM | POA: Diagnosis not present

## 2018-12-10 DIAGNOSIS — Z7982 Long term (current) use of aspirin: Secondary | ICD-10-CM

## 2018-12-10 DIAGNOSIS — Z833 Family history of diabetes mellitus: Secondary | ICD-10-CM

## 2018-12-10 DIAGNOSIS — J189 Pneumonia, unspecified organism: Secondary | ICD-10-CM | POA: Diagnosis present

## 2018-12-10 DIAGNOSIS — R197 Diarrhea, unspecified: Secondary | ICD-10-CM | POA: Diagnosis present

## 2018-12-10 DIAGNOSIS — A419 Sepsis, unspecified organism: Secondary | ICD-10-CM | POA: Diagnosis present

## 2018-12-10 DIAGNOSIS — Z452 Encounter for adjustment and management of vascular access device: Secondary | ICD-10-CM | POA: Diagnosis not present

## 2018-12-10 DIAGNOSIS — E1165 Type 2 diabetes mellitus with hyperglycemia: Secondary | ICD-10-CM | POA: Diagnosis present

## 2018-12-10 DIAGNOSIS — E785 Hyperlipidemia, unspecified: Secondary | ICD-10-CM | POA: Diagnosis present

## 2018-12-10 DIAGNOSIS — J9601 Acute respiratory failure with hypoxia: Secondary | ICD-10-CM

## 2018-12-10 DIAGNOSIS — R34 Anuria and oliguria: Secondary | ICD-10-CM | POA: Diagnosis not present

## 2018-12-10 DIAGNOSIS — R0902 Hypoxemia: Secondary | ICD-10-CM

## 2018-12-10 DIAGNOSIS — R000 Tachycardia, unspecified: Secondary | ICD-10-CM | POA: Diagnosis not present

## 2018-12-10 DIAGNOSIS — Z66 Do not resuscitate: Secondary | ICD-10-CM | POA: Diagnosis not present

## 2018-12-10 DIAGNOSIS — R402142 Coma scale, eyes open, spontaneous, at arrival to emergency department: Secondary | ICD-10-CM | POA: Diagnosis present

## 2018-12-10 DIAGNOSIS — E1122 Type 2 diabetes mellitus with diabetic chronic kidney disease: Secondary | ICD-10-CM | POA: Diagnosis not present

## 2018-12-10 DIAGNOSIS — N183 Chronic kidney disease, stage 3 (moderate): Secondary | ICD-10-CM | POA: Diagnosis present

## 2018-12-10 DIAGNOSIS — R6889 Other general symptoms and signs: Secondary | ICD-10-CM | POA: Diagnosis not present

## 2018-12-10 DIAGNOSIS — Z4682 Encounter for fitting and adjustment of non-vascular catheter: Secondary | ICD-10-CM | POA: Diagnosis not present

## 2018-12-10 DIAGNOSIS — Z992 Dependence on renal dialysis: Secondary | ICD-10-CM

## 2018-12-10 DIAGNOSIS — J984 Other disorders of lung: Secondary | ICD-10-CM | POA: Diagnosis not present

## 2018-12-10 DIAGNOSIS — R402362 Coma scale, best motor response, obeys commands, at arrival to emergency department: Secondary | ICD-10-CM | POA: Diagnosis not present

## 2018-12-10 DIAGNOSIS — Z9911 Dependence on respirator [ventilator] status: Secondary | ICD-10-CM | POA: Diagnosis not present

## 2018-12-10 DIAGNOSIS — Z978 Presence of other specified devices: Secondary | ICD-10-CM

## 2018-12-10 DIAGNOSIS — J9690 Respiratory failure, unspecified, unspecified whether with hypoxia or hypercapnia: Secondary | ICD-10-CM

## 2018-12-10 DIAGNOSIS — E876 Hypokalemia: Secondary | ICD-10-CM | POA: Diagnosis not present

## 2018-12-10 NOTE — ED Triage Notes (Signed)
Pt reports using the toilet, passed out and fell off the toilet. Denies hitting head.

## 2018-12-11 ENCOUNTER — Encounter (HOSPITAL_COMMUNITY): Payer: Self-pay

## 2018-12-11 ENCOUNTER — Other Ambulatory Visit: Payer: Self-pay

## 2018-12-11 ENCOUNTER — Emergency Department (HOSPITAL_COMMUNITY): Payer: BLUE CROSS/BLUE SHIELD

## 2018-12-11 DIAGNOSIS — R197 Diarrhea, unspecified: Secondary | ICD-10-CM | POA: Diagnosis present

## 2018-12-11 DIAGNOSIS — J1289 Other viral pneumonia: Secondary | ICD-10-CM | POA: Diagnosis present

## 2018-12-11 DIAGNOSIS — Z20822 Contact with and (suspected) exposure to covid-19: Secondary | ICD-10-CM | POA: Diagnosis present

## 2018-12-11 DIAGNOSIS — R6521 Severe sepsis with septic shock: Secondary | ICD-10-CM | POA: Diagnosis not present

## 2018-12-11 DIAGNOSIS — R6520 Severe sepsis without septic shock: Secondary | ICD-10-CM | POA: Diagnosis not present

## 2018-12-11 DIAGNOSIS — A419 Sepsis, unspecified organism: Secondary | ICD-10-CM | POA: Diagnosis not present

## 2018-12-11 DIAGNOSIS — R402362 Coma scale, best motor response, obeys commands, at arrival to emergency department: Secondary | ICD-10-CM | POA: Diagnosis present

## 2018-12-11 DIAGNOSIS — Z6841 Body Mass Index (BMI) 40.0 and over, adult: Secondary | ICD-10-CM | POA: Diagnosis not present

## 2018-12-11 DIAGNOSIS — Z452 Encounter for adjustment and management of vascular access device: Secondary | ICD-10-CM | POA: Diagnosis not present

## 2018-12-11 DIAGNOSIS — E1165 Type 2 diabetes mellitus with hyperglycemia: Secondary | ICD-10-CM | POA: Diagnosis present

## 2018-12-11 DIAGNOSIS — I10 Essential (primary) hypertension: Secondary | ICD-10-CM | POA: Diagnosis not present

## 2018-12-11 DIAGNOSIS — E8770 Fluid overload, unspecified: Secondary | ICD-10-CM | POA: Diagnosis not present

## 2018-12-11 DIAGNOSIS — J189 Pneumonia, unspecified organism: Secondary | ICD-10-CM | POA: Diagnosis present

## 2018-12-11 DIAGNOSIS — R55 Syncope and collapse: Secondary | ICD-10-CM | POA: Diagnosis present

## 2018-12-11 DIAGNOSIS — R6889 Other general symptoms and signs: Secondary | ICD-10-CM

## 2018-12-11 DIAGNOSIS — E11649 Type 2 diabetes mellitus with hypoglycemia without coma: Secondary | ICD-10-CM | POA: Diagnosis not present

## 2018-12-11 DIAGNOSIS — Z4682 Encounter for fitting and adjustment of non-vascular catheter: Secondary | ICD-10-CM | POA: Diagnosis not present

## 2018-12-11 DIAGNOSIS — R579 Shock, unspecified: Secondary | ICD-10-CM | POA: Diagnosis not present

## 2018-12-11 DIAGNOSIS — F410 Panic disorder [episodic paroxysmal anxiety] without agoraphobia: Secondary | ICD-10-CM | POA: Diagnosis present

## 2018-12-11 DIAGNOSIS — R918 Other nonspecific abnormal finding of lung field: Secondary | ICD-10-CM | POA: Diagnosis not present

## 2018-12-11 DIAGNOSIS — E785 Hyperlipidemia, unspecified: Secondary | ICD-10-CM | POA: Diagnosis present

## 2018-12-11 DIAGNOSIS — Z66 Do not resuscitate: Secondary | ICD-10-CM | POA: Diagnosis present

## 2018-12-11 DIAGNOSIS — R402252 Coma scale, best verbal response, oriented, at arrival to emergency department: Secondary | ICD-10-CM | POA: Diagnosis present

## 2018-12-11 DIAGNOSIS — J984 Other disorders of lung: Secondary | ICD-10-CM | POA: Diagnosis not present

## 2018-12-11 DIAGNOSIS — A4189 Other specified sepsis: Secondary | ICD-10-CM | POA: Diagnosis present

## 2018-12-11 DIAGNOSIS — N183 Chronic kidney disease, stage 3 (moderate): Secondary | ICD-10-CM | POA: Diagnosis not present

## 2018-12-11 DIAGNOSIS — E1122 Type 2 diabetes mellitus with diabetic chronic kidney disease: Secondary | ICD-10-CM | POA: Diagnosis not present

## 2018-12-11 DIAGNOSIS — Z9911 Dependence on respirator [ventilator] status: Secondary | ICD-10-CM | POA: Diagnosis not present

## 2018-12-11 DIAGNOSIS — E872 Acidosis: Secondary | ICD-10-CM | POA: Diagnosis present

## 2018-12-11 DIAGNOSIS — G839 Paralytic syndrome, unspecified: Secondary | ICD-10-CM | POA: Diagnosis present

## 2018-12-11 DIAGNOSIS — J9601 Acute respiratory failure with hypoxia: Secondary | ICD-10-CM | POA: Diagnosis present

## 2018-12-11 DIAGNOSIS — I129 Hypertensive chronic kidney disease with stage 1 through stage 4 chronic kidney disease, or unspecified chronic kidney disease: Secondary | ICD-10-CM | POA: Diagnosis present

## 2018-12-11 DIAGNOSIS — E119 Type 2 diabetes mellitus without complications: Secondary | ICD-10-CM | POA: Diagnosis not present

## 2018-12-11 DIAGNOSIS — J80 Acute respiratory distress syndrome: Secondary | ICD-10-CM | POA: Diagnosis not present

## 2018-12-11 DIAGNOSIS — N179 Acute kidney failure, unspecified: Secondary | ICD-10-CM | POA: Diagnosis not present

## 2018-12-11 DIAGNOSIS — R402142 Coma scale, eyes open, spontaneous, at arrival to emergency department: Secondary | ICD-10-CM | POA: Diagnosis present

## 2018-12-11 DIAGNOSIS — D696 Thrombocytopenia, unspecified: Secondary | ICD-10-CM | POA: Diagnosis not present

## 2018-12-11 LAB — CBC WITH DIFFERENTIAL/PLATELET
Abs Immature Granulocytes: 0.11 10*3/uL — ABNORMAL HIGH (ref 0.00–0.07)
Basophils Absolute: 0 10*3/uL (ref 0.0–0.1)
Basophils Relative: 0 %
Eosinophils Absolute: 0 10*3/uL (ref 0.0–0.5)
Eosinophils Relative: 0 %
HCT: 47.4 % (ref 39.0–52.0)
Hemoglobin: 15.8 g/dL (ref 13.0–17.0)
Immature Granulocytes: 1 %
Lymphocytes Relative: 11 %
Lymphs Abs: 1.1 10*3/uL (ref 0.7–4.0)
MCH: 29 pg (ref 26.0–34.0)
MCHC: 33.3 g/dL (ref 30.0–36.0)
MCV: 87 fL (ref 80.0–100.0)
Monocytes Absolute: 0.8 10*3/uL (ref 0.1–1.0)
Monocytes Relative: 8 %
Neutro Abs: 7.8 10*3/uL — ABNORMAL HIGH (ref 1.7–7.7)
Neutrophils Relative %: 80 %
Platelets: 117 10*3/uL — ABNORMAL LOW (ref 150–400)
RBC: 5.45 MIL/uL (ref 4.22–5.81)
RDW: 12 % (ref 11.5–15.5)
WBC: 9.8 10*3/uL (ref 4.0–10.5)
nRBC: 0 % (ref 0.0–0.2)

## 2018-12-11 LAB — GLUCOSE, CAPILLARY
Glucose-Capillary: 48 mg/dL — ABNORMAL LOW (ref 70–99)
Glucose-Capillary: 104 mg/dL — ABNORMAL HIGH (ref 70–99)
Glucose-Capillary: 94 mg/dL (ref 70–99)
Glucose-Capillary: 75 mg/dL (ref 70–99)
Glucose-Capillary: 70 mg/dL (ref 70–99)
Glucose-Capillary: 106 mg/dL — ABNORMAL HIGH (ref 70–99)

## 2018-12-11 LAB — URINALYSIS, ROUTINE W REFLEX MICROSCOPIC
Bilirubin Urine: NEGATIVE
Glucose, UA: NEGATIVE mg/dL
Ketones, ur: NEGATIVE mg/dL
Leukocytes,Ua: NEGATIVE
Nitrite: NEGATIVE
Protein, ur: 100 mg/dL — AB
Specific Gravity, Urine: 1.016 (ref 1.005–1.030)
pH: 5 (ref 5.0–8.0)

## 2018-12-11 LAB — COMPREHENSIVE METABOLIC PANEL
ALT: 45 U/L — ABNORMAL HIGH (ref 0–44)
AST: 111 U/L — ABNORMAL HIGH (ref 15–41)
Albumin: 3.4 g/dL — ABNORMAL LOW (ref 3.5–5.0)
Alkaline Phosphatase: 62 U/L (ref 38–126)
Anion gap: 13 (ref 5–15)
BUN: 25 mg/dL — ABNORMAL HIGH (ref 6–20)
CO2: 26 mmol/L (ref 22–32)
Calcium: 8.4 mg/dL — ABNORMAL LOW (ref 8.9–10.3)
Chloride: 93 mmol/L — ABNORMAL LOW (ref 98–111)
Creatinine, Ser: 2.23 mg/dL — ABNORMAL HIGH (ref 0.61–1.24)
GFR calc Af Amer: 39 mL/min — ABNORMAL LOW (ref >60)
GFR calc non Af Amer: 34 mL/min — ABNORMAL LOW (ref >60)
Glucose, Bld: 96 mg/dL (ref 70–99)
Potassium: 4.4 mmol/L (ref 3.5–5.1)
Sodium: 132 mmol/L — ABNORMAL LOW (ref 135–145)
Total Bilirubin: 1.7 mg/dL — ABNORMAL HIGH (ref 0.3–1.2)
Total Protein: 6.8 g/dL (ref 6.5–8.1)

## 2018-12-11 LAB — HIV ANTIBODY (ROUTINE TESTING W REFLEX): HIV Screen 4th Generation wRfx: NONREACTIVE

## 2018-12-11 LAB — RESPIRATORY PANEL BY PCR

## 2018-12-11 LAB — CBC
HCT: 35.5 % — ABNORMAL LOW (ref 39.0–52.0)
Hemoglobin: 12.1 g/dL — ABNORMAL LOW (ref 13.0–17.0)
MCH: 30.3 pg (ref 26.0–34.0)
MCHC: 34.1 g/dL (ref 30.0–36.0)
MCV: 89 fL (ref 80.0–100.0)
Platelets: 93 K/uL — ABNORMAL LOW (ref 150–400)
RBC: 3.99 MIL/uL — ABNORMAL LOW (ref 4.22–5.81)
RDW: 12.1 % (ref 11.5–15.5)
WBC: 5.8 K/uL (ref 4.0–10.5)
nRBC: 0 % (ref 0.0–0.2)

## 2018-12-11 LAB — CREATININE, SERUM
Creatinine, Ser: 2.33 mg/dL — ABNORMAL HIGH (ref 0.61–1.24)
GFR calc Af Amer: 37 mL/min — ABNORMAL LOW (ref >60)
GFR calc non Af Amer: 32 mL/min — ABNORMAL LOW (ref >60)

## 2018-12-11 LAB — INFLUENZA PANEL BY PCR (TYPE A & B)
Influenza A By PCR: NEGATIVE
Influenza B By PCR: NEGATIVE

## 2018-12-11 LAB — TROPONIN I: Troponin I: <0.03 ng/mL

## 2018-12-11 LAB — FERRITIN: Ferritin: 930 ng/mL — ABNORMAL HIGH (ref 24–336)

## 2018-12-11 LAB — LACTATE DEHYDROGENASE: LDH: 381 U/L — ABNORMAL HIGH (ref 98–192)

## 2018-12-11 LAB — LACTIC ACID, PLASMA
Lactic Acid, Venous: 1.8 mmol/L (ref 0.5–1.9)
Lactic Acid, Venous: 1 mmol/L (ref 0.5–1.9)

## 2018-12-11 LAB — MRSA PCR SCREENING: MRSA by PCR: NEGATIVE

## 2018-12-11 LAB — STREP PNEUMONIAE URINARY ANTIGEN: Strep Pneumo Urinary Antigen: NEGATIVE

## 2018-12-11 MED ORDER — SODIUM CHLORIDE 0.9 % IV SOLN
500.0000 mg | Freq: Once | INTRAVENOUS | Status: AC
  Administered 2018-12-11: 500 mg via INTRAVENOUS
  Filled 2018-12-11: qty 500

## 2018-12-11 MED ORDER — ACETAMINOPHEN 500 MG PO TABS
1000.0000 mg | ORAL_TABLET | Freq: Once | ORAL | Status: AC
  Administered 2018-12-11: 1000 mg via ORAL
  Filled 2018-12-11: qty 2

## 2018-12-11 MED ORDER — HYDRALAZINE HCL 20 MG/ML IJ SOLN: 10.0000 mg | INTRAMUSCULAR | Status: DC | PRN

## 2018-12-11 MED ORDER — SODIUM CHLORIDE 0.9 % IV SOLN
500.0000 mg | INTRAVENOUS | Status: AC
  Administered 2018-12-11 – 2018-12-15 (×5): 500 mg via INTRAVENOUS
  Filled 2018-12-11 (×5): qty 500

## 2018-12-11 MED ORDER — ACETAMINOPHEN 325 MG PO TABS
650.0000 mg | ORAL_TABLET | Freq: Four times a day (QID) | ORAL | Status: DC | PRN
  Administered 2018-12-13: 650 mg via ORAL
  Filled 2018-12-11: qty 2

## 2018-12-11 MED ORDER — ORAL CARE MOUTH RINSE
15.0000 mL | Freq: Two times a day (BID) | OROMUCOSAL | Status: DC
  Administered 2018-12-11 – 2018-12-12 (×3): 15 mL via OROMUCOSAL

## 2018-12-11 MED ORDER — INSULIN ASPART 100 UNIT/ML ~~LOC~~ SOLN: 0.0000 [IU] | Freq: Three times a day (TID) | SUBCUTANEOUS | Status: DC

## 2018-12-11 MED ORDER — ONDANSETRON HCL 4 MG PO TABS: 4.0000 mg | ORAL_TABLET | Freq: Four times a day (QID) | ORAL | Status: DC | PRN

## 2018-12-11 MED ORDER — ASPIRIN 81 MG PO CHEW
81.0000 mg | CHEWABLE_TABLET | Freq: Every day | ORAL | Status: DC
  Administered 2018-12-11 – 2018-12-12 (×2): 81 mg via ORAL
  Filled 2018-12-11 (×2): qty 1

## 2018-12-11 MED ORDER — ACETAMINOPHEN 650 MG RE SUPP: 650.0000 mg | Freq: Four times a day (QID) | RECTAL | Status: DC | PRN

## 2018-12-11 MED ORDER — ONDANSETRON HCL 4 MG/2ML IJ SOLN
4.0000 mg | Freq: Four times a day (QID) | INTRAMUSCULAR | Status: DC | PRN
  Administered 2018-12-11: 22:00:00 4 mg via INTRAVENOUS
  Filled 2018-12-11: qty 2

## 2018-12-11 MED ORDER — SODIUM CHLORIDE 0.9 % IV SOLN
Freq: Once | INTRAVENOUS | Status: AC
  Administered 2018-12-11: 02:00:00 via INTRAVENOUS

## 2018-12-11 MED ORDER — DEXTROSE 50 % IV SOLN
INTRAVENOUS | Status: AC
  Administered 2018-12-11: 06:00:00 50 mL via INTRAVENOUS
  Filled 2018-12-11: qty 50

## 2018-12-11 MED ORDER — SODIUM CHLORIDE 0.9 % IV SOLN
1.0000 g | Freq: Once | INTRAVENOUS | Status: AC
  Administered 2018-12-11: 1 g via INTRAVENOUS
  Filled 2018-12-11: qty 10

## 2018-12-11 MED ORDER — INSULIN GLARGINE 100 UNIT/ML ~~LOC~~ SOLN
90.0000 [IU] | Freq: Every day | SUBCUTANEOUS | Status: DC
  Filled 2018-12-11 (×2): qty 0.9

## 2018-12-11 MED ORDER — DEXTROSE 50 % IV SOLN
50.0000 mL | Freq: Once | INTRAVENOUS | Status: AC
  Administered 2018-12-11: 50 mL via INTRAVENOUS

## 2018-12-11 MED ORDER — ENOXAPARIN SODIUM 40 MG/0.4ML ~~LOC~~ SOLN
40.0000 mg | SUBCUTANEOUS | Status: DC
  Administered 2018-12-11 – 2018-12-12 (×2): 40 mg via SUBCUTANEOUS
  Filled 2018-12-11 (×2): qty 0.4

## 2018-12-11 MED ORDER — ATORVASTATIN CALCIUM 10 MG PO TABS
20.0000 mg | ORAL_TABLET | Freq: Every day | ORAL | Status: DC
  Administered 2018-12-11: 09:00:00 20 mg via ORAL
  Filled 2018-12-11: qty 2

## 2018-12-11 MED ORDER — SODIUM CHLORIDE 0.9 % IV SOLN
1.0000 g | INTRAVENOUS | Status: DC
  Administered 2018-12-11 – 2018-12-12 (×2): 1 g via INTRAVENOUS
  Filled 2018-12-11 (×3): qty 10

## 2018-12-11 NOTE — ED Notes (Signed)
Slowed Azithromax to 200 ml/hr for pt comfort.

## 2018-12-11 NOTE — ED Notes (Signed)
Pt made aware of the need for urine and provided a urinal.

## 2018-12-11 NOTE — H&P (Addendum)
History and Physical    Charles Yoder. MGN:[id number] DOB: [birth year] DOA: 12/29/2018  PCP: Patient, No Pcp Per  Patient coming from: Home.  Chief Complaint: Loss of consciousness.  HPI: Charles Yoder. is a 48 y.o. male with history of diabetes mellitus type 2 hypertension was brought to the ER after patient had an episode of loss of consciousness.  Patient states he was not feeling well last 3 days with some cough.  Has been exam diarrhea last 2 days.  Has not had any fever chills or recorded fever at home.  Has not had any recent travel or sick contacts.  Patient's cough has been productive of sputum with brownish discoloration.  Last night while patient was at home moving his bowels on the commode passed out.  Does not know exactly how long he passed out his daughter came in found him immediately and EMS was called and was brought to the ER.  ED Course: In the ER patient was hypotensive tachycardic febrile with temperatures around 101 F.  Lactate was normal.  Labs revealed WBC 9.8 hemoglobin 15.8 platelets 117 AST 111 ALT 45 creatinine 2.2 with chest x-ray showing bilateral infiltrates fever.  Blood sugar was around 100.  Patient was given fluid bolus following which blood pressure improved.  EKG was showing tachycardia.  Given the present pandemic there was concern for coronavirus COVID-19 which has been ordered.  Influenza panel was negative.  Patient was empirically started on antibiotic admitted for acute respiratory failure with possible developing sepsis and strongly concerning for COVID-19.  Review of Systems: As per HPI, rest all negative.   Past Medical History:  Diagnosis Date  . Diabetes mellitus 2004   type 2  . Hyperlipidemia 05/16/2012  . Hypertension     Past Surgical History:  Procedure Laterality Date  . ELBOW FRACTURE SURGERY  as a child   left     reports that he has never smoked. He has quit using smokeless tobacco.  His smokeless tobacco use included snuff.  He reports that he does not drink alcohol or use drugs.  No Known Allergies  Family History  Problem Relation Age of Onset  . Colon cancer Mother 61  . Diabetes Mother   . Diabetes Father   . Stroke Other        aunt  . CAD Other        GM, late onset  . Prostate cancer Neg Hx     Prior to Admission medications   Medication Sig Start Date End Date Taking? Authorizing Provider  aspirin 81 MG tablet Take 81 mg by mouth daily.    [provider]  atorvastatin (LIPITOR) 20 MG tablet Take 1 tablet (20 mg total) by mouth daily. 11/22/17   Renato Shin, MD  B-D ULTRAFINE III SHORT PEN 31G X 8 MM MISC USE ONCE A DAY 12/01/18   Renato Shin, MD  glucose blood test strip 1 each by Other route 2 (two) times daily. And lancets 2/day 02/22/16   Renato Shin, MD  ibuprofen (ADVIL,MOTRIN) 200 MG tablet Take 800 mg by mouth every 6 (six) hours as needed for moderate pain.    [provider]  Insulin Glargine (LANTUS SOLOSTAR) 100 UNIT/ML Solostar Pen Inject 100 Units into the skin every morning. 10/20/18   Renato Shin, MD  losartan-hydrochlorothiazide (HYZAAR) 100-25 MG tablet Take 1 tablet by mouth daily. 06/18/18   Colon Branch, MD  [facility] DELICA LANCETS FINE MISC Check blood sugar  no more than twice daily. 04/25/15   Colon Branch, MD  [facility] VERIO test strip CHECK BLOOD SUGAR NO MORE THAN TWICE DAILY 02/06/18   Renato Shin, MD  sildenafil (REVATIO) 20 MG tablet 1-5 pills, as needed for ED symptoms 03/23/16   Renato Shin, MD    Physical Exam: Vitals:   12/31/2018 2355 12/11/18 0130 12/11/18 0240 12/11/18 0245  BP:  110/62  105/72  Pulse:  (!) 117  (!) 118  Resp:  18  19  Temp:   (!) 101.5 F (38.6 C)   TempSrc:   Oral   SpO2:  94%  90%  Weight: 124.7 kg     Height: 6\' 1"  (1.854 m)         Constitutional: Moderately built and nourished. Vitals:   12/28/2018 2355 12/11/18 0130 12/11/18 0240 12/11/18 0245  BP:  110/62  105/72  Pulse:  (!) 117  (!) 118  Resp:  18  19   Temp:   (!) 101.5 F (38.6 C)   TempSrc:   Oral   SpO2:  94%  90%  Weight: 124.7 kg     Height: 6\' 1"  (1.854 m)      Eyes: Anicteric no pallor. ENMT: No discharge from the ears eyes nose or mouth. Neck: No mass felt.  No neck rigidity. Respiratory: No rhonchi or crepitations. Cardiovascular: S1-S2 heard. Abdomen: Soft nontender bowel sounds present. Musculoskeletal: No edema.  No joint effusion. Skin: No rash. Neurologic: Alert awake oriented to time place and person.  Moves all extremities. Psychiatric: Appears normal per normal affect.   Labs on Admission: I have personally reviewed following labs and imaging studies  CBC: Recent Labs  Lab 12/11/18 0040  WBC 9.8  NEUTROABS 7.8*  HGB 15.8  HCT 47.4  MCV 87.0  PLT 621*   Basic Metabolic Panel: Recent Labs  Lab 12/11/18 0040  NA 132*  K 4.4  CL 93*  CO2 26  GLUCOSE 96  BUN 25*  CREATININE 2.23*  CALCIUM 8.4*   GFR: Estimated Creatinine Clearance: 56 mL/min (A) (by C-G formula based on SCr of 2.23 mg/dL (H)). Liver Function Tests: Recent Labs  Lab 12/11/18 0040  AST 111*  ALT 45*  ALKPHOS 62  BILITOT 1.7*  PROT 6.8  ALBUMIN 3.4*   No results for input(s): LIPASE, AMYLASE in the last 168 hours. No results for input(s): AMMONIA in the last 168 hours. Coagulation Profile: No results for input(s): INR, PROTIME in the last 168 hours. Cardiac Enzymes: No results for input(s): CKTOTAL, CKMB, CKMBINDEX, TROPONINI in the last 168 hours. BNP (last 3 results) No results for input(s): PROBNP in the last 8760 hours. HbA1C: No results for input(s): HGBA1C in the last 72 hours. CBG: No results for input(s): GLUCAP in the last 168 hours. Lipid Profile: No results for input(s): CHOL, HDL, LDLCALC, TRIG, CHOLHDL, LDLDIRECT in the last 72 hours. Thyroid Function Tests: No results for input(s): TSH, T4TOTAL, FREET4, T3FREE, THYROIDAB in the last 72 hours. Anemia Panel: No results for input(s): VITAMINB12,  FOLATE, FERRITIN, TIBC, IRON, RETICCTPCT in the last 72 hours. Urine analysis:    Component Value Date/Time   COLORURINE YELLOW 12/11/2018 0339   APPEARANCEUR HAZY (A) 12/11/2018 0339   LABSPEC 1.016 12/11/2018 0339   PHURINE 5.0 12/11/2018 0339   GLUCOSEU NEGATIVE 12/11/2018 0339   HGBUR MODERATE (A) 12/11/2018 0339   BILIRUBINUR NEGATIVE 12/11/2018 0339   KETONESUR NEGATIVE 12/11/2018 0339   PROTEINUR 100 (A) 12/11/2018 0339   NITRITE NEGATIVE  12/11/2018 0339   LEUKOCYTESUR NEGATIVE 12/11/2018 0339   Sepsis Labs: @LABRCNTIP (procalcitonin:4,lacticidven:4) )No results found for this or any previous visit (from the past 240 hour(s)).   Radiological Exams on Admission: Dg Chest Port 1 View  Result Date: 12/11/2018 CLINICAL DATA:  48 year old male with fever and shortness of breath. EXAM: PORTABLE CHEST 1 VIEW COMPARISON:  Chest CT dated 06/14/2018 FINDINGS: There is shallow inspiration. Patchy areas of airspace opacities in mid to lower lung fields bilaterally may represent atelectasis or infiltrate. No significant pleural effusion. No pneumothorax. Borderline cardiomegaly. No acute osseous pathology. IMPRESSION: Bilateral mid to lower lung field atelectasis versus infiltrate. Electronically Signed   By: Anner Crete M.D.   On: 12/11/2018 01:08    EKG: Independently reviewed.  Sinus tachycardia.  Assessment/Plan Principal Problem:   Acute respiratory failure with hypoxia (HCC) Active Problems:   DM II (diabetes mellitus, type II), controlled ([place])   Essential hypertension   CAP (community acquired pneumonia)   ARF (acute renal failure) ([place])    1. Acute respiratory failure with hypoxia with possible developing sepsis -patient is requiring 5 L oxygen at this time.  Primary concern is for COVID-19 coronavirus infection.  Patient is empirically placed on antibiotics for community-acquired pneumonia.  Follow cultures lactate troponin.  Patient blood pressures at this time improved.   Will be sent to go more fluids given the possibility of COVID-19. 2. Diabetes mellitus type 2 on Lantus insulin takes 100 units in the morning.  Shortly after admission patient has become hypoglycemic with blood sugar in the 45.  Will hold Lantus and insulin coverage and check CBGs q. hourly for now.  D50 was given and also encouraged taking oral feeds. 3. Acute renal failure could be from diarrhea and hypotension.  Holding ARB and hydrochlorothiazide.  Did receive fluid in the ER.  Closely monitor intake output and metabolic panel. 4. Thrombocytopenia appears to be new could be from sepsis however since patient has acute renal failure with fever will have to rule out other causes including hemolytic process for which I will order LDH and smear review. 5. Hypertension holding antihypertensives due to acute renal failure and hypotension on presentation. 6. Elevated LFTs could be from possible developing sepsis.  Has some nausea.  And also had diarrhea.  If LFTs further worsen may have to consider doing abdominal imaging.   DVT prophylaxis: Lovenox. Code Status: Full code. Family Communication: No family at the bedside. Disposition Plan: Home. Consults called: None. Admission status: Inpatient.   Rise Patience MD [facility] Pager [phone].  If 7PM-7AM, please contact night-coverage [email or web address] Password [email or web address]  12/11/2018, 5:15 AM

## 2018-12-11 NOTE — Progress Notes (Signed)
During the triage for syncope, the pt reported having a cough and not feeling well since Monday. When the pt was placed on the monitor the pt's SP02 at 84% but was asymptomatic and not complaining of any SOB. Quickly the pt became a sepsis workup and possible covid rule out.

## 2018-12-11 NOTE — ED Notes (Signed)
Patient denies pain states he is just uncomfortable on the stretcher. Patient sats will drop to 88 on 5 liters EDP aware , patient denies  Sob , states he doesn't feel bad.

## 2018-12-11 NOTE — Progress Notes (Signed)
Hypoglycemic Event  CBG: 48  Treatment: D50 50 mL (25 gm)  Symptoms: Shaky  Follow-up CBG: [id number] CBG Result:104  Possible Reasons for Event: Inadequate meal intake  Comments/MD notified:protocol    Vivia Ewing

## 2018-12-11 NOTE — ED Provider Notes (Signed)
[facility] EMERGENCY DEPARTMENT Provider Note   CSN: [id number] Arrival date & time: 12/12/2018  2339    History   Chief Complaint Chief Complaint  Patient presents with  . Loss of Consciousness    HPI Charles Yoder. is a 48 y.o. male.     Patient presents to the emergency department from home.  Patient reports that he passed out tonight.  He was sitting on the toilet having a bowel movement when he passed out.  Prior to passing out he started to feel hot and got sweaty.  He did not injure himself.  He denies that he was straining to have a bowel movement at the time of passing out.  He has not felt well for a couple of days, has had a cough.     Past Medical History:  Diagnosis Date  . Diabetes mellitus 2004   type 2  . Hyperlipidemia 05/16/2012  . Hypertension     Patient Active Problem List   Diagnosis Date Noted  . Acute respiratory failure with hypoxia ([place]) 12/11/2018  . CAP (community acquired pneumonia) 12/11/2018  . ARF (acute renal failure) ([place]) 12/11/2018  . Severe sepsis ([place]) 12/11/2018  . Suspected Covid-19 Virus Infection 12/11/2018  . PCP NOTES >>>>>>>>>>>>>>>>>>>>>. 10/21/2016  . Erectile dysfunction 02/22/2016  . Hyperlipidemia 05/16/2012  . Annual physical exam 11/09/2011  . DIZZINESS 06/09/2010  . EXOPHTHALMOS 01/05/2009  . DM II (diabetes mellitus, type II), controlled ([place]) 04/23/2008  . Essential hypertension 04/23/2008    Past Surgical History:  Procedure Laterality Date  . ELBOW FRACTURE SURGERY  as a child   left        Home Medications    Prior to Admission medications   Medication Sig Start Date End Date Taking? Authorizing Provider  amLODipine (NORVASC) 10 MG tablet Take 10 mg by mouth daily. 11/24/18  Yes [provider]  aspirin 81 MG tablet Take 81 mg by mouth daily.   Yes [provider]  atorvastatin (LIPITOR) 20 MG tablet Take 1 tablet (20 mg total) by mouth daily. 11/22/17  Yes  Renato Shin, MD  benzonatate (TESSALON) 200 MG capsule Take 200 mg by mouth 3 (three) times daily as needed for cough.  12/09/18 12/16/18 Yes [provider]  brompheniramine-pseudoephedrine-DM 30-2-10 MG/5ML syrup Take 5 mLs by mouth 4 (four) times daily as needed (for cough).  12/09/18 12/19/18 Yes [provider]  doxycycline (VIBRA-TABS) 100 MG tablet Take 100 mg by mouth 2 (two) times daily. 12/09/18 12/16/18 Yes [provider]  Insulin Glargine (LANTUS SOLOSTAR) 100 UNIT/ML Solostar Pen Inject 100 Units into the skin every morning. 10/20/18  Yes Renato Shin, MD  losartan-hydrochlorothiazide (HYZAAR) 100-25 MG tablet Take 1 tablet by mouth daily. 06/18/18  Yes Paz, Alda Berthold, MD  B-D ULTRAFINE III SHORT PEN 31G X 8 MM MISC USE ONCE A DAY 12/01/18   Renato Shin, MD  glucose blood test strip 1 each by Other route 2 (two) times daily. And lancets 2/day 02/22/16   Renato Shin, MD  [facility] DELICA LANCETS FINE MISC Check blood sugar no more than twice daily. 04/25/15   Colon Branch, MD  [facility] VERIO test strip CHECK BLOOD SUGAR NO MORE THAN TWICE DAILY 02/06/18   Renato Shin, MD    Family History Family History  Problem Relation Age of Onset  . Colon cancer Mother 52  . Diabetes Mother   . Diabetes Father   . Stroke Other  aunt  . CAD Other        GM, late onset  . Prostate cancer Neg Hx     Social History Social History   Tobacco Use  . Smoking status: Never Smoker  . Smokeless tobacco: Former Systems developer    Types: Snuff  Substance Use Topics  . Alcohol use: No    Alcohol/week: 0.0 standard drinks  . Drug use: No     Allergies   Patient has no known allergies.   Review of Systems Review of Systems  Respiratory: Positive for cough.   Neurological: Positive for syncope.  All other systems reviewed and are negative.    Physical Exam Updated Vital Signs BP (!) 87/51 (BP Location: Left Arm)   Pulse (!) 132   Temp 99.2 F (37.3 C)   Resp (!) 23    Ht 6\' 1"  (1.854 m)   Wt 127.2 kg   SpO2 (!) 87%   BMI 37.00 kg/m   Physical Exam Vitals signs and nursing note reviewed.  Constitutional:      General: He is not in acute distress.    Appearance: Normal appearance. He is well-developed.  HENT:     Head: Normocephalic and atraumatic.     Right Ear: Hearing normal.     Left Ear: Hearing normal.     Nose: Nose normal.  Eyes:     Conjunctiva/sclera: Conjunctivae normal.     Pupils: Pupils are equal, round, and reactive to light.  Neck:     Musculoskeletal: Normal range of motion and neck supple.  Cardiovascular:     Rate and Rhythm: Regular rhythm. Tachycardia present.     Heart sounds: S1 normal and S2 normal. No murmur. No friction rub. No gallop.   Pulmonary:     Effort: Pulmonary effort is normal. Tachypnea present. No respiratory distress.     Breath sounds: Decreased breath sounds, rhonchi and rales present.  Chest:     Chest wall: No tenderness.  Abdominal:     General: Bowel sounds are normal.     Palpations: Abdomen is soft.     Tenderness: There is no abdominal tenderness. There is no guarding or rebound. Negative signs include Murphy's sign and McBurney's sign.     Hernia: No hernia is present.  Musculoskeletal: Normal range of motion.  Skin:    General: Skin is warm and dry.     Findings: No rash.  Neurological:     Mental Status: He is alert and oriented to person, place, and time.     GCS: GCS eye subscore is 4. GCS verbal subscore is 5. GCS motor subscore is 6.     Cranial Nerves: No cranial nerve deficit.     Sensory: No sensory deficit.     Coordination: Coordination normal.  Psychiatric:        Speech: Speech normal.        Behavior: Behavior normal.        Thought Content: Thought content normal.      ED Treatments / Results  Labs (all labs ordered are listed, but only abnormal results are displayed) Labs Reviewed  COMPREHENSIVE METABOLIC PANEL - Abnormal; Notable for the following components:       Result Value   Sodium 132 (*)    Chloride 93 (*)    BUN 25 (*)    Creatinine, Ser 2.23 (*)    Calcium 8.4 (*)    Albumin 3.4 (*)    AST 111 (*)    ALT  45 (*)    Total Bilirubin 1.7 (*)    GFR calc non Af Amer 34 (*)    GFR calc Af Amer 39 (*)    All other components within normal limits  CBC WITH DIFFERENTIAL/PLATELET - Abnormal; Notable for the following components:   Platelets 117 (*)    Neutro Abs 7.8 (*)    Abs Immature Granulocytes 0.11 (*)    All other components within normal limits  URINALYSIS, ROUTINE W REFLEX MICROSCOPIC - Abnormal; Notable for the following components:   APPearance HAZY (*)    Hgb urine dipstick MODERATE (*)    Protein, ur 100 (*)    Bacteria, UA RARE (*)    All other components within normal limits  CBC - Abnormal; Notable for the following components:   RBC 3.99 (*)    Hemoglobin 12.1 (*)    HCT 35.5 (*)    Platelets 93 (*)    All other components within normal limits  CREATININE, SERUM - Abnormal; Notable for the following components:   Creatinine, Ser 2.33 (*)    GFR calc non Af Amer 32 (*)    GFR calc Af Amer 37 (*)    All other components within normal limits  GLUCOSE, CAPILLARY - Abnormal; Notable for the following components:   Glucose-Capillary 48 (*)    All other components within normal limits  GLUCOSE, CAPILLARY - Abnormal; Notable for the following components:   Glucose-Capillary 104 (*)    All other components within normal limits  LACTATE DEHYDROGENASE - Abnormal; Notable for the following components:   LDH 381 (*)    All other components within normal limits  FERRITIN - Abnormal; Notable for the following components:   Ferritin 930 (*)    All other components within normal limits  GLUCOSE, CAPILLARY - Abnormal; Notable for the following components:   Glucose-Capillary 106 (*)    All other components within normal limits  RESPIRATORY PANEL BY PCR  MRSA PCR SCREENING  CULTURE, BLOOD (ROUTINE X 2)  CULTURE, BLOOD  (ROUTINE X 2)  URINE CULTURE  EXPECTORATED SPUTUM ASSESSMENT W REFEX TO RESP CULTURE  GRAM STAIN  NOVEL CORONAVIRUS, NAA (HOSPITAL ORDER, SEND-OUT TO REF LAB)  LACTIC ACID, PLASMA  LACTIC ACID, PLASMA  INFLUENZA PANEL BY PCR (TYPE A & B)  STREP PNEUMONIAE URINARY ANTIGEN  HIV ANTIBODY (ROUTINE TESTING W REFLEX)  TROPONIN I  GLUCOSE, CAPILLARY  GLUCOSE, CAPILLARY  GLUCOSE, CAPILLARY  LEGIONELLA PNEUMOPHILA SEROGP 1 UR AG  PATHOLOGIST SMEAR REVIEW  HAPTOGLOBIN  HEPATITIS PANEL, ACUTE  CBC WITH DIFFERENTIAL/PLATELET  COMPREHENSIVE METABOLIC PANEL    EKG EKG Interpretation  Date/Time:  Thursday December 11 2018 01:26:43 EDT Ventricular Rate:  118 PR Interval:    QRS Duration: 96 QT Interval:  316 QTC Calculation: 443 R Axis:   32 Text Interpretation:  Sinus tachycardia Probable left atrial enlargement No significant change since last tracing Confirmed by Orpah Greek ([id number]) on 12/11/2018 1:52:25 AM   Radiology Dg Chest Port 1 View  Result Date: 12/11/2018 CLINICAL DATA:  48 year old male with fever and shortness of breath. EXAM: PORTABLE CHEST 1 VIEW COMPARISON:  Chest CT dated 06/14/2018 FINDINGS: There is shallow inspiration. Patchy areas of airspace opacities in mid to lower lung fields bilaterally may represent atelectasis or infiltrate. No significant pleural effusion. No pneumothorax. Borderline cardiomegaly. No acute osseous pathology. IMPRESSION: Bilateral mid to lower lung field atelectasis versus infiltrate. Electronically Signed   By: Anner Crete M.D.   On: 12/11/2018 01:08  Procedures Procedures (including critical care time)  Medications Ordered in ED Medications  aspirin chewable tablet 81 mg (81 mg Oral Given 12/11/18 0900)  acetaminophen (TYLENOL) tablet 650 mg (has no administration in time range)    Or  acetaminophen (TYLENOL) suppository 650 mg (has no administration in time range)  ondansetron (ZOFRAN) tablet 4 mg ( Oral See Alternative  12/11/18 2213)    Or  ondansetron (ZOFRAN) injection 4 mg (4 mg Intravenous Given 12/11/18 2213)  cefTRIAXone (ROCEPHIN) 1 g in sodium chloride 0.9 % 100 mL IVPB (1 g Intravenous New Bag/Given 12/11/18 2205)  azithromycin (ZITHROMAX) 500 mg in sodium chloride 0.9 % 250 mL IVPB (500 mg Intravenous New Bag/Given 12/11/18 2100)  enoxaparin (LOVENOX) injection 40 mg (40 mg Subcutaneous Given 12/11/18 2206)  hydrALAZINE (APRESOLINE) injection 10 mg (has no administration in time range)  MEDLINE mouth rinse (15 mLs Mouth Rinse Not Given 12/11/18 2131)  cefTRIAXone (ROCEPHIN) 1 g in sodium chloride 0.9 % 100 mL IVPB (0 g Intravenous Stopped 12/11/18 0123)  azithromycin (ZITHROMAX) 500 mg in sodium chloride 0.9 % 250 mL IVPB (0 mg Intravenous Stopped 12/11/18 0445)  0.9 %  sodium chloride infusion ( Intravenous Stopped 12/11/18 0934)  acetaminophen (TYLENOL) tablet 1,000 mg (1,000 mg Oral Given 12/11/18 0256)  dextrose 50 % solution 50 mL (50 mLs Intravenous Given 12/11/18 0615)     Initial Impression / Assessment and Plan / ED Course  I have reviewed the triage vital signs and the nursing notes.  Pertinent labs & imaging results that were available during my care of the patient were reviewed by me and considered in my medical decision making (see chart for details).        Patient presents to the emergency department for evaluation of syncopal episode.  Patient was sitting on the toilet when he passed out.  He did have onset of feeling hot, sweaty, I would then weak and dizzy prior to passing out.  No injury from the syncopal episode.  At arrival, patient was found to be febrile.  He was unaware of this.  He does report that he has had some cough and chest congestion for the last couple of days.  Patient tachycardic, tachypneic and hypoxic.  Sepsis considered.  No hypotension noted.  Patient administered Rocephin and Zithromax for possible infiltrates on his x-ray.  Influenza negative, respiratory virus panel pending.   With no lactic acid elevation and no significant hypotension, fluids withheld because of the possibility that this is COVID-19 and the risk for ARDS which would be worsened by volume overload.  Will monitor vital signs closely.  Admit to hospital for further management as he has significant oxygen demand which is new.  Octavia Bruckner. was evaluated in Emergency Department on 12/12/2018 for the symptoms described in the history of present illness. He was evaluated in the context of the global COVID-19 pandemic, which necessitated consideration that the patient might be at risk for infection with the SARS-CoV-2 virus that causes COVID-19. Institutional protocols and algorithms that pertain to the evaluation of patients at risk for COVID-19 are in a state of rapid change based on information released by regulatory bodies including the CDC and federal and state organizations. These policies and algorithms were followed during the patient's care in the ED.   CRITICAL CARE Performed by: Orpah Greek   Total critical care time: 40 minutes  Critical care time was exclusive of separately billable procedures and treating other patients.  Critical care was  necessary to treat or prevent imminent or life-threatening deterioration.  Critical care was time spent personally by me on the following activities: development of treatment plan with patient and/or surrogate as well as nursing, discussions with consultants, evaluation of patient's response to treatment, examination of patient, obtaining history from patient or surrogate, ordering and performing treatments and interventions, ordering and review of laboratory studies, ordering and review of radiographic studies, pulse oximetry and re-evaluation of patient's condition.   Final Clinical Impressions(s) / ED Diagnoses   Final diagnoses:  Acute respiratory failure with hypoxia [facility])    ED Discharge Orders    None       Yena Tisby, Gwenyth Allegra, MD 12/12/18 0045

## 2018-12-11 NOTE — Progress Notes (Signed)
All possessions given to patient's daughter to take home in a clean biohazard bag.

## 2018-12-11 NOTE — Progress Notes (Addendum)
PROGRESS NOTE  Charles Yoder. MEB:[id number] DOB: 01/15/1971 DOA: 12/16/2018 PCP: Patient, No Pcp Per  HPI/Brief Narrative  Charles Yoder. is a 48 y.o. year old male with medical history significant for type 2 diabetes, HTN who presented on 12/29/2018 with cough x 3 days, diarrhea x 2 days with some slight improvement with outpatient antibiotics, diminished appetite/oral intake and reported syncope while using the commode and was found to have fever of 101, tachycardia and tachypnea with oxygen desaturationto 85% on room air. He was admitted with working diagnosis of sepsis secondary to presumed pneumonia and COVID rule out.    Assessment/Plan:  #Sepsis with organ dysfunction secondary to presumed bilateral pneumonia On admission febrile, tachycardic, tachypneic with increased oxygen requirement of 5 L and chest x-ray showing concern for bilateral infiltrate versus atelectasis in addition to cough with recent sick contact (wife with bronchitis) and acute renal failure.  Flu panel, RVP negative given additional GI symptoms, elevated LFTs, thrombocytopenia and high oxygen requirements very high risk for CO VID -Continue empiric ceftriaxone, azithromycin for presumed CAP coverage -CO VID PCR pending -Follow blood cultures, pneumo and Legionella, sputum culture  #Acute hypoxic respiratory failure, secondary presumed pneumonia Suspect bacterial etiology but cannot rule out potential viral most likely CO VID especially considering patient's high oxygen requirement.  Still on 5 L but stable with no increased work of breathing -Wean oxygen as able -Infectious work-up as mentioned above -Incentive spirometry encouraged  #AKI Baseline 1.27, currently peak of 2.33. Holding home losartan/HCTZ.  Patient did have hypotensive blood pressure on arrival with SBP in the 70s so very likely prerenal etiology still having normal urine output - Completed timed IV fluids (avoiding long-term fluids given high  concern for CO VID which increases risk for ARDS) -Monitor BMP  #Thrombocytopenia Can be seen in viral infections (CO VID concern).  Slightly anemic with hemoglobin of 12.7, LDH was also sent which was high but acute phase reactant which can be high in setting of sepsis and especially potential COVID infection -Pending peripheral smear review by pathology -We will add haptoglobin, doubt hemolytic component, but will do for completion  #Elevated LFTs Alk phos within normal limits, total bili slightly elevated, with only slight abdominal discomfort but otherwise no nausea, no vomiting.  Suspect likely/sepsis physiology and also no lab abnormality in the setting of potential CO VID infection -Hepatitis panel for completion -Trend CMP daily -Discontinue home atorvastatin  #Hypotension, improved greatly - Head timed IV fluids blood pressure now quite stable -We will continue to home home BP medications and closely monitor BP  #COVID rule out High risk given extremely high oxygen requirements, cough, shortness of breath, recent sick contact (no known COVID exposure), concerning for opacities on chest x-ray -COVID PCR pending, add ferritin  #T2DM A1c 9.8 Relative hypoglycemia episode x1, on admission since then glucose range has been 75-104 -Holding home Lantus ( 100 U),   -CBG closely monitor. hold SSI for now to avoid further hypoglycemia.  #Hypertension Holding home losartan/HCTZ as mentioned above    Cultures:  Blood cultures 4/1, urine culture 4/1    DVT prophylaxis: [place] lipidemia, stable  Consultants:  None    Procedures:  None  Antimicrobials:  Code Status: Full code  Family Communication: No family at bedside  Disposition Plan: Wean O2, follow blood cultures, CO VID PCR pending,       Subjective Feels well Mild cough No overt shortness of breath Minimal abdominal pain  Objective: Vitals:  12/11/18 0515 12/11/18 0530 12/11/18 0600 12/11/18 0608   BP: (!) 75/52 (!) 77/54  102/75  Pulse: 100 (!) 103  (!) 108  Resp: 19 (!) 25  18  Temp:   99.3 F (37.4 C)   TempSrc:   Oral   SpO2: 93% 92%  92%  Weight:   127.2 kg   Height:   '6\' 1"'$  (1.854 m)     Intake/Output Summary (Last 24 hours) at 12/11/2018 0736 Last data filed at 12/11/2018 0600 Gross per 24 hour  Intake 350 ml  Output --  Net 350 ml   Filed Weights   01/03/2019 2355 12/11/18 0600  Weight: 124.7 kg 127.2 kg    Exam:  Constitutional:normal appearing male Eyes: EOMI, anicteric, normal conjunctivae ENMT: Oropharynx with moist mucous membranes,  Cardiovascular: RRR no MRGs, with no peripheral edema Respiratory: Normal respiratory effort on 5  Abdomen: Soft,non-tender, decreased bowel sounds Skin: No rash ulcers, or lesions. Without skin tenting  Neurologic: Grossly no focal neuro deficit. Psychiatric:Appropriate affect, and mood. Mental status AAOx3  Data Reviewed: CBC: Recent Labs  Lab 12/11/18 0040 12/11/18 0520  WBC 9.8 5.8  NEUTROABS 7.8*  --   HGB 15.8 12.1*  HCT 47.4 35.5*  MCV 87.0 89.0  PLT 117* 93*   Basic Metabolic Panel: Recent Labs  Lab 12/11/18 0040 12/11/18 0520  NA 132*  --   K 4.4  --   CL 93*  --   CO2 26  --   GLUCOSE 96  --   BUN 25*  --   CREATININE 2.23* 2.33*  CALCIUM 8.4*  --    GFR: Estimated Creatinine Clearance: 54.2 mL/min (A) (by C-G formula based on SCr of 2.33 mg/dL (H)). Liver Function Tests: Recent Labs  Lab 12/11/18 0040  AST 111*  ALT 45*  ALKPHOS 62  BILITOT 1.7*  PROT 6.8  ALBUMIN 3.4*   No results for input(s): LIPASE, AMYLASE in the last 168 hours. No results for input(s): AMMONIA in the last 168 hours. Coagulation Profile: No results for input(s): INR, PROTIME in the last 168 hours. Cardiac Enzymes: Recent Labs  Lab 12/11/18 0520  TROPONINI <0.03   BNP (last 3 results) No results for input(s): PROBNP in the last 8760 hours. HbA1C: No results for input(s): HGBA1C in the last 72  hours. CBG: Recent Labs  Lab 12/11/18 0607 12/11/18 0631  GLUCAP 48* 104*   Lipid Profile: No results for input(s): CHOL, HDL, LDLCALC, TRIG, CHOLHDL, LDLDIRECT in the last 72 hours. Thyroid Function Tests: No results for input(s): TSH, T4TOTAL, FREET4, T3FREE, THYROIDAB in the last 72 hours. Anemia Panel: No results for input(s): VITAMINB12, FOLATE, FERRITIN, TIBC, IRON, RETICCTPCT in the last 72 hours. Urine analysis:    Component Value Date/Time   COLORURINE YELLOW 12/11/2018 0339   APPEARANCEUR HAZY (A) 12/11/2018 0339   LABSPEC 1.016 12/11/2018 0339   PHURINE 5.0 12/11/2018 0339   GLUCOSEU NEGATIVE 12/11/2018 0339   HGBUR MODERATE (A) 12/11/2018 0339   BILIRUBINUR NEGATIVE 12/11/2018 0339   KETONESUR NEGATIVE 12/11/2018 0339   PROTEINUR 100 (A) 12/11/2018 0339   NITRITE NEGATIVE 12/11/2018 0339   LEUKOCYTESUR NEGATIVE 12/11/2018 0339   Sepsis Labs: '@LABRCNTIP'$ (procalcitonin:4,lacticidven:4)  )No results found for this or any previous visit (from the past 240 hour(s)).    Studies: Dg Chest Port 1 View  Result Date: 12/11/2018 CLINICAL DATA:  48 year old male with fever and shortness of breath. EXAM: PORTABLE CHEST 1 VIEW COMPARISON:  Chest CT dated 06/14/2018 FINDINGS: There is shallow  inspiration. Patchy areas of airspace opacities in mid to lower lung fields bilaterally may represent atelectasis or infiltrate. No significant pleural effusion. No pneumothorax. Borderline cardiomegaly. No acute osseous pathology. IMPRESSION: Bilateral mid to lower lung field atelectasis versus infiltrate. Electronically Signed   By: Anner Crete M.D.   On: 12/11/2018 01:08    Scheduled Meds:  aspirin  81 mg Oral Daily   atorvastatin  20 mg Oral Daily   enoxaparin (LOVENOX) injection  40 mg Subcutaneous Q24H    Continuous Infusions:  azithromycin     cefTRIAXone (ROCEPHIN)  IV       LOS: 0 days     Desiree Hane, MD [facility]

## 2018-12-11 NOTE — ED Notes (Signed)
This Rn completed an in and out cath and attempted to draw the [place]. Phlebotomy is going to attempt the [place].

## 2018-12-12 ENCOUNTER — Ambulatory Visit (INDEPENDENT_AMBULATORY_CARE_PROVIDER_SITE_OTHER): Payer: BLUE CROSS/BLUE SHIELD | Admitting: Endocrinology

## 2018-12-12 ENCOUNTER — Inpatient Hospital Stay: Payer: Self-pay

## 2018-12-12 ENCOUNTER — Ambulatory Visit: Payer: BLUE CROSS/BLUE SHIELD | Admitting: Endocrinology

## 2018-12-12 ENCOUNTER — Inpatient Hospital Stay (HOSPITAL_COMMUNITY): Payer: BLUE CROSS/BLUE SHIELD

## 2018-12-12 DIAGNOSIS — E119 Type 2 diabetes mellitus without complications: Secondary | ICD-10-CM

## 2018-12-12 DIAGNOSIS — R6889 Other general symptoms and signs: Secondary | ICD-10-CM

## 2018-12-12 DIAGNOSIS — J80 Acute respiratory distress syndrome: Secondary | ICD-10-CM

## 2018-12-12 DIAGNOSIS — J9601 Acute respiratory failure with hypoxia: Secondary | ICD-10-CM

## 2018-12-12 DIAGNOSIS — J189 Pneumonia, unspecified organism: Secondary | ICD-10-CM

## 2018-12-12 LAB — CBC WITH DIFFERENTIAL/PLATELET
Abs Immature Granulocytes: 0.09 10*3/uL — ABNORMAL HIGH (ref 0.00–0.07)
Basophils Absolute: 0 10*3/uL (ref 0.0–0.1)
Basophils Relative: 0 %
Eosinophils Absolute: 0 10*3/uL (ref 0.0–0.5)
Eosinophils Relative: 0 %
HCT: 45.1 % (ref 39.0–52.0)
Hemoglobin: 15.2 g/dL (ref 13.0–17.0)
Immature Granulocytes: 1 %
Lymphocytes Relative: 17 %
Lymphs Abs: 1.4 10*3/uL (ref 0.7–4.0)
MCH: 29.2 pg (ref 26.0–34.0)
MCHC: 33.7 g/dL (ref 30.0–36.0)
MCV: 86.7 fL (ref 80.0–100.0)
Monocytes Absolute: 0.7 10*3/uL (ref 0.1–1.0)
Monocytes Relative: 9 %
Neutro Abs: 6 10*3/uL (ref 1.7–7.7)
Neutrophils Relative %: 73 %
Platelets: 109 10*3/uL — ABNORMAL LOW (ref 150–400)
RBC: 5.2 MIL/uL (ref 4.22–5.81)
RDW: 11.9 % (ref 11.5–15.5)
WBC: 8.2 10*3/uL (ref 4.0–10.5)
nRBC: 0 % (ref 0.0–0.2)

## 2018-12-12 LAB — HEPATITIS PANEL, ACUTE
HCV Ab: 0.1 s/co ratio (ref 0.0–0.9)
Hep A IgM: NEGATIVE
Hep B C IgM: NEGATIVE
Hepatitis B Surface Ag: NEGATIVE

## 2018-12-12 LAB — POCT I-STAT 7, (LYTES, BLD GAS, ICA,H+H)
Acid-Base Excess: 7 mmol/L — ABNORMAL HIGH (ref 0.0–2.0)
Acid-Base Excess: 2 mmol/L (ref 0.0–2.0)
Bicarbonate: 29.1 mmol/L — ABNORMAL HIGH (ref 20.0–28.0)
Bicarbonate: 26.4 mmol/L (ref 20.0–28.0)
Calcium, Ion: 1.08 mmol/L — ABNORMAL LOW (ref 1.15–1.40)
Calcium, Ion: 1.1 mmol/L — ABNORMAL LOW (ref 1.15–1.40)
HCT: 42 % (ref 39.0–52.0)
HCT: 45 % (ref 39.0–52.0)
Hemoglobin: 14.3 g/dL (ref 13.0–17.0)
Hemoglobin: 15.3 g/dL (ref 13.0–17.0)
O2 Saturation: 96 %
O2 Saturation: 88 %
Patient temperature: 98.6
Patient temperature: 100.4
Potassium: 3 mmol/L — ABNORMAL LOW (ref 3.5–5.1)
Potassium: 3.4 mmol/L — ABNORMAL LOW (ref 3.5–5.1)
Sodium: 133 mmol/L — ABNORMAL LOW (ref 135–145)
Sodium: 135 mmol/L (ref 135–145)
TCO2: 30 mmol/L (ref 22–32)
TCO2: 28 mmol/L (ref 22–32)
pCO2 arterial: 31.5 mmHg — ABNORMAL LOW (ref 32.0–48.0)
pCO2 arterial: 42.9 mmHg (ref 32.0–48.0)
pH, Arterial: 7.573 — ABNORMAL HIGH (ref 7.350–7.450)
pH, Arterial: 7.401 (ref 7.350–7.450)
pO2, Arterial: 71 mmHg — ABNORMAL LOW (ref 83.0–108.0)
pO2, Arterial: 58 mmHg — ABNORMAL LOW (ref 83.0–108.0)

## 2018-12-12 LAB — COMPREHENSIVE METABOLIC PANEL
ALT: 33 U/L (ref 0–44)
AST: 83 U/L — ABNORMAL HIGH (ref 15–41)
Albumin: 2.9 g/dL — ABNORMAL LOW (ref 3.5–5.0)
Alkaline Phosphatase: 54 U/L (ref 38–126)
Anion gap: 15 (ref 5–15)
BUN: 21 mg/dL — ABNORMAL HIGH (ref 6–20)
CO2: 24 mmol/L (ref 22–32)
Calcium: 8.2 mg/dL — ABNORMAL LOW (ref 8.9–10.3)
Chloride: 97 mmol/L — ABNORMAL LOW (ref 98–111)
Creatinine, Ser: 1.93 mg/dL — ABNORMAL HIGH (ref 0.61–1.24)
GFR calc Af Amer: 46 mL/min — ABNORMAL LOW (ref >60)
GFR calc non Af Amer: 40 mL/min — ABNORMAL LOW (ref >60)
Glucose, Bld: 133 mg/dL — ABNORMAL HIGH (ref 70–99)
Potassium: 2.9 mmol/L — ABNORMAL LOW (ref 3.5–5.1)
Sodium: 136 mmol/L (ref 135–145)
Total Bilirubin: 0.8 mg/dL (ref 0.3–1.2)
Total Protein: 6.2 g/dL — ABNORMAL LOW (ref 6.5–8.1)

## 2018-12-12 LAB — MAGNESIUM
Magnesium: 2.2 mg/dL (ref 1.7–2.4)
Magnesium: 2.2 mg/dL (ref 1.7–2.4)

## 2018-12-12 LAB — URINE CULTURE: Culture: NO GROWTH

## 2018-12-12 LAB — GLUCOSE, CAPILLARY
Glucose-Capillary: 140 mg/dL — ABNORMAL HIGH (ref 70–99)
Glucose-Capillary: 158 mg/dL — ABNORMAL HIGH (ref 70–99)
Glucose-Capillary: 202 mg/dL — ABNORMAL HIGH (ref 70–99)
Glucose-Capillary: 148 mg/dL — ABNORMAL HIGH (ref 70–99)
Glucose-Capillary: 114 mg/dL — ABNORMAL HIGH (ref 70–99)

## 2018-12-12 LAB — LEGIONELLA PNEUMOPHILA SEROGP 1 UR AG: L. pneumophila Serogp 1 Ur Ag: NEGATIVE

## 2018-12-12 LAB — HAPTOGLOBIN: Haptoglobin: 304 mg/dL (ref 23–355)

## 2018-12-12 LAB — PHOSPHORUS
Phosphorus: 3.6 mg/dL (ref 2.5–4.6)
Phosphorus: 5.5 mg/dL — ABNORMAL HIGH (ref 2.5–4.6)

## 2018-12-12 LAB — PATHOLOGIST SMEAR REVIEW

## 2018-12-12 MED ORDER — HYDROXYCHLOROQUINE SULFATE 200 MG PO TABS
400.0000 mg | ORAL_TABLET | Freq: Two times a day (BID) | ORAL | Status: AC
  Administered 2018-12-12 (×2): 400 mg
  Filled 2018-12-12 (×2): qty 2

## 2018-12-12 MED ORDER — FENTANYL CITRATE (PF) 100 MCG/2ML IJ SOLN
50.0000 ug | Freq: Once | INTRAMUSCULAR | Status: AC
  Administered 2018-12-12: 12:00:00 50 ug via INTRAVENOUS

## 2018-12-12 MED ORDER — ORAL CARE MOUTH RINSE
15.0000 mL | OROMUCOSAL | Status: DC
  Administered 2018-12-12 – 2018-12-17 (×47): 15 mL via OROMUCOSAL

## 2018-12-12 MED ORDER — CHLORHEXIDINE GLUCONATE 0.12% ORAL RINSE (MEDLINE KIT)
15.0000 mL | Freq: Two times a day (BID) | OROMUCOSAL | Status: DC
  Administered 2018-12-12 – 2018-12-16 (×10): 15 mL via OROMUCOSAL

## 2018-12-12 MED ORDER — MIDAZOLAM 50MG/50ML (1MG/ML) PREMIX INFUSION
0.0000 mg/h | INTRAVENOUS | Status: DC
  Administered 2018-12-12: 2 mg/h via INTRAVENOUS
  Administered 2018-12-13: 1.5 mg/h via INTRAVENOUS
  Administered 2018-12-13: 19:00:00 5 mg/h via INTRAVENOUS
  Filled 2018-12-12 (×3): qty 50

## 2018-12-12 MED ORDER — FREE WATER
200.0000 mL | Freq: Three times a day (TID) | Status: DC
  Administered 2018-12-12 – 2018-12-13 (×3): 200 mL

## 2018-12-12 MED ORDER — FENTANYL CITRATE (PF) 100 MCG/2ML IJ SOLN
100.0000 ug | INTRAMUSCULAR | Status: DC | PRN
  Administered 2018-12-12 (×2): 100 ug via INTRAVENOUS
  Filled 2018-12-12 (×2): qty 2

## 2018-12-12 MED ORDER — VECURONIUM BROMIDE 10 MG IV SOLR
10.0000 mg | Freq: Once | INTRAVENOUS | Status: AC
  Administered 2018-12-12: 10 mg via INTRAVENOUS
  Filled 2018-12-12: qty 10

## 2018-12-12 MED ORDER — ROCURONIUM BROMIDE 50 MG/5ML IV SOLN
50.0000 mg | Freq: Once | INTRAVENOUS | Status: AC
  Administered 2018-12-12: 13:00:00 50 mg via INTRAVENOUS

## 2018-12-12 MED ORDER — LORAZEPAM 2 MG/ML IJ SOLN
0.5000 mg | Freq: Once | INTRAMUSCULAR | Status: AC
  Administered 2018-12-12: 06:00:00 0.5 mg via INTRAVENOUS
  Filled 2018-12-12: qty 1

## 2018-12-12 MED ORDER — PANTOPRAZOLE SODIUM 40 MG IV SOLR
40.0000 mg | Freq: Every day | INTRAVENOUS | Status: DC
  Administered 2018-12-12: 40 mg via INTRAVENOUS
  Filled 2018-12-12 (×2): qty 40

## 2018-12-12 MED ORDER — ALBUTEROL SULFATE HFA 108 (90 BASE) MCG/ACT IN AERS
1.0000 | INHALATION_SPRAY | RESPIRATORY_TRACT | Status: DC | PRN
  Administered 2018-12-12: 2 via RESPIRATORY_TRACT
  Filled 2018-12-12: qty 6.7

## 2018-12-12 MED ORDER — VITAL HIGH PROTEIN PO LIQD
1000.0000 mL | ORAL | Status: DC
  Administered 2018-12-12: 18:00:00 1000 mL

## 2018-12-12 MED ORDER — MIDAZOLAM HCL 2 MG/2ML IJ SOLN
INTRAMUSCULAR | Status: AC
  Administered 2018-12-12: 2 mg via INTRAVENOUS
  Filled 2018-12-12: qty 2

## 2018-12-12 MED ORDER — DEXMEDETOMIDINE HCL IN NACL 400 MCG/100ML IV SOLN
0.0000 ug/kg/h | INTRAVENOUS | Status: DC
  Administered 2018-12-12 (×2): 1.2 ug/kg/h via INTRAVENOUS
  Filled 2018-12-12 (×2): qty 100

## 2018-12-12 MED ORDER — PHENYLEPHRINE HCL-NACL 10-0.9 MG/250ML-% IV SOLN
0.0000 ug/min | INTRAVENOUS | Status: DC
  Administered 2018-12-12: 18:00:00 70 ug/min via INTRAVENOUS
  Administered 2018-12-12: 17:00:00 200 ug/min via INTRAVENOUS
  Administered 2018-12-13: 150 ug/min via INTRAVENOUS
  Administered 2018-12-13: 06:00:00 85 ug/min via INTRAVENOUS
  Administered 2018-12-13: 75 ug/min via INTRAVENOUS
  Administered 2018-12-13: 150 ug/min via INTRAVENOUS
  Administered 2018-12-13 (×2): 110 ug/min via INTRAVENOUS
  Administered 2018-12-13: 105 ug/min via INTRAVENOUS
  Administered 2018-12-13: 12:00:00 50 ug/min via INTRAVENOUS
  Administered 2018-12-13: 22:00:00 13.333 ug/min via INTRAVENOUS
  Administered 2018-12-14: 01:00:00 150 ug/min via INTRAVENOUS
  Filled 2018-12-12 (×14): qty 250

## 2018-12-12 MED ORDER — IPRATROPIUM BROMIDE HFA 17 MCG/ACT IN AERS
2.0000 | INHALATION_SPRAY | RESPIRATORY_TRACT | Status: DC | PRN
  Administered 2018-12-12: 2 via RESPIRATORY_TRACT
  Filled 2018-12-12: qty 12.9

## 2018-12-12 MED ORDER — HYDROXYCHLOROQUINE SULFATE 200 MG PO TABS
200.0000 mg | ORAL_TABLET | Freq: Two times a day (BID) | ORAL | Status: DC
  Administered 2018-12-13 – 2018-12-16 (×4): 200 mg via ORAL
  Filled 2018-12-12 (×4): qty 1

## 2018-12-12 MED ORDER — VITAL HIGH PROTEIN PO LIQD: 1000.0000 mL | ORAL | Status: DC

## 2018-12-12 MED ORDER — PRO-STAT SUGAR FREE PO LIQD
30.0000 mL | Freq: Two times a day (BID) | ORAL | Status: DC
  Administered 2018-12-12 – 2018-12-13 (×3): 30 mL
  Filled 2018-12-12 (×4): qty 30

## 2018-12-12 MED ORDER — FENTANYL CITRATE (PF) 100 MCG/2ML IJ SOLN
INTRAMUSCULAR | Status: AC
  Administered 2018-12-12: 50 ug via INTRAVENOUS
  Filled 2018-12-12: qty 2

## 2018-12-12 MED ORDER — ROCURONIUM BROMIDE 50 MG/5ML IV SOLN
50.0000 mg | Freq: Once | INTRAVENOUS | Status: AC
  Administered 2018-12-12: 50 mg via INTRAVENOUS

## 2018-12-12 MED ORDER — FENTANYL 2500MCG IN NS 250ML (10MCG/ML) PREMIX INFUSION
0.0000 ug/h | INTRAVENOUS | Status: DC
  Administered 2018-12-12: 250 ug/h via INTRAVENOUS
  Administered 2018-12-13 (×2): 200 ug/h via INTRAVENOUS
  Filled 2018-12-12 (×3): qty 250

## 2018-12-12 MED ORDER — MIDAZOLAM HCL 2 MG/2ML IJ SOLN: 1.0000 mg | INTRAMUSCULAR | Status: DC | PRN

## 2018-12-12 MED ORDER — INSULIN ASPART 100 UNIT/ML ~~LOC~~ SOLN
0.0000 [IU] | SUBCUTANEOUS | Status: DC
  Administered 2018-12-12: 3 [IU] via SUBCUTANEOUS
  Administered 2018-12-12: 12:00:00 4 [IU] via SUBCUTANEOUS
  Administered 2018-12-12: 7 [IU] via SUBCUTANEOUS
  Administered 2018-12-13: 3 [IU] via SUBCUTANEOUS
  Administered 2018-12-13: 11 [IU] via SUBCUTANEOUS
  Administered 2018-12-13: 09:00:00 7 [IU] via SUBCUTANEOUS
  Administered 2018-12-13: 16:00:00 11 [IU] via SUBCUTANEOUS
  Administered 2018-12-13: 7 [IU] via SUBCUTANEOUS
  Administered 2018-12-13: 4 [IU] via SUBCUTANEOUS
  Administered 2018-12-14: 11 [IU] via SUBCUTANEOUS
  Administered 2018-12-14 (×2): 3 [IU] via SUBCUTANEOUS
  Administered 2018-12-14: 4 [IU] via SUBCUTANEOUS
  Administered 2018-12-14: 19:00:00 11 [IU] via SUBCUTANEOUS
  Administered 2018-12-14: 7 [IU] via SUBCUTANEOUS
  Administered 2018-12-15 (×4): 11 [IU] via SUBCUTANEOUS
  Administered 2018-12-15: 4 [IU] via SUBCUTANEOUS
  Administered 2018-12-15: 08:00:00 11 [IU] via SUBCUTANEOUS
  Administered 2018-12-16: 23:00:00 3 [IU] via SUBCUTANEOUS
  Administered 2018-12-16: 4 [IU] via SUBCUTANEOUS
  Administered 2018-12-16 (×2): 7 [IU] via SUBCUTANEOUS
  Administered 2018-12-16: 3 [IU] via SUBCUTANEOUS
  Administered 2018-12-16: 4 [IU] via SUBCUTANEOUS

## 2018-12-12 MED ORDER — FENTANYL CITRATE (PF) 100 MCG/2ML IJ SOLN
100.0000 ug | INTRAMUSCULAR | Status: AC | PRN
  Administered 2018-12-12 (×3): 100 ug via INTRAVENOUS
  Filled 2018-12-12 (×3): qty 2

## 2018-12-12 MED ORDER — MIDAZOLAM HCL 2 MG/2ML IJ SOLN
2.0000 mg | Freq: Once | INTRAMUSCULAR | Status: AC
  Administered 2018-12-12: 12:00:00 2 mg via INTRAVENOUS

## 2018-12-12 MED ORDER — ASPIRIN 81 MG PO CHEW
81.0000 mg | CHEWABLE_TABLET | Freq: Every day | ORAL | Status: DC
  Administered 2018-12-13: 09:00:00 81 mg
  Filled 2018-12-12: qty 1

## 2018-12-12 MED ORDER — ETOMIDATE 2 MG/ML IV SOLN
20.0000 mg | Freq: Once | INTRAVENOUS | Status: AC
  Administered 2018-12-12: 20 mg via INTRAVENOUS

## 2018-12-12 NOTE — Progress Notes (Signed)
Received order for stat PICC.  PICC team not present on [place] at this time.  Patients BP hypotensive.  Unable to place PICC until BP stable.  Inform RN if central line required stat recommend placement by MD.

## 2018-12-12 NOTE — Progress Notes (Signed)
Attending cardiology review of echo order:  Due to the spread of COVID-19, departmental policy is to review orders for procedures and determine appropriateness regarding testing at this time. The following criteria are being used to limit potential exposure and spread of infection.  Is the patient being evaluated for COVID-19 infection: YES, just intubated.  Is the patient actively having infectious res[iratory symptoms or fever: yes Does the patient have a known history of prior cardiovascular disease: none documented Would the test change management of the patient: not at this time Can the test be performed at a later time: yes  Special circumstances: Is the patient undergoing evaluation for embolic CVA: no Is the patient planned for chemotherapy:  no  Based on the review above, this study is not to be performed at this time. Patient just intubated, actively being tested for coronavirus. Department policy at this time is to not perform echocardiograms in possible COVID patients. If he rules out for COVID, please contact the echo department and the cardiology attending of the day will re-evaluate.  Buford Dresser, MD, PhD [facility]  [address], [place], [place] [address] [phone]

## 2018-12-12 NOTE — Progress Notes (Signed)
BP remains hypotensive ( below 90)  Unable to do PICC at this time.  Will continue to monitor and plan to reevaluate in am.

## 2018-12-12 NOTE — Progress Notes (Signed)
RT note: sputum sample obtained and sent down to main lab without complications.,

## 2018-12-12 NOTE — Progress Notes (Signed)
   Subjective:    Patient ID: Charles Yoder., male    DOB: 30-Mar-1971, 48 y.o.   MRN: [id number]  HPI Visit is canceled, as pt is in the hospital   Review of Systems     Objective:   Physical Exam        Assessment & Plan:

## 2018-12-12 NOTE — Procedures (Signed)
Intubation Procedure Note Charles Yoder [id number] 1970-12-12  Procedure: Intubation Indications: Respiratory insufficiency  Procedure Details Consent: Risks of procedure as well as the alternatives and risks of each were explained to the (patient/caregiver).  Consent for procedure obtained. Time Out: Verified patient identification, verified procedure, site/side was marked, verified correct patient position, special equipment/implants available, medications/allergies/relevent history reviewed, required imaging and test results available.  Performed  Drugs Etomidate 20mg  IV, Rocuronium 100mg  IV, Versed 2mg  IV, Fentanyl 41mcg IV DL x 1 with GS 4 blade Grade 2 view 7.5 ET tube passed through cords under direct visualization Placement confirmed with bilateral breath sounds, positive EtCO2 change and smoke in tube   Evaluation Hemodynamic Status: BP stable throughout; O2 sats: transiently fell during during procedure Patient's Current Condition: stable Complications: No apparent complications Patient did tolerate procedure well. Chest X-ray ordered to verify placement.  CXR: pending.   Simonne Maffucci 12/12/2018

## 2018-12-12 NOTE — Progress Notes (Signed)
PROGRESS NOTE  Charles Yoder. ZOX:[id number] DOB: Jul 09, 1971 DOA: 12/28/2018 PCP: Patient, No Pcp Per  HPI/Brief Narrative  Charles Yoder. is a 48 y.o. year old male with medical history significant for type 2 diabetes, HTN who presented on 12/12/2018 with cough x 3 days, diarrhea x 2 days with some slight improvement with outpatient antibiotics, diminished appetite/oral intake and reported syncope while using the commode and was found to have fever of 101, tachycardia and tachypnea with oxygen desaturationto 85% on room air. He was admitted with working diagnosis of sepsis secondary to presumed pneumonia and COVID rule out.  Assessment/Plan:  #Acute hypoxic respiratory failure, secondary presumed pneumonia,worsening Increased oxygen requirement from 5 L to 14 L HFNC in 24 hours with oxygen saturation from 87%-90%. PaO2/FiO2 ratio 93, with poor oxygen saturation. Has no increased work of breathing on exam, still speaking in complete sentences. Suspect bacterial etiology but cannot rule out COVID especially considering patient's high oxygen requirement. -Consult PCCM given high oxygen requirements, may need early intubation given high COVID risk and avoidance of NIPPV  -Spo2 goal > 92%( currently requiring 14 L HFNC) -Infectious work-up as mentioned above -Incentive spirometry encouraged  #Sepsis with organ dysfunction secondary to presumed bilateral pneumonia On admission febrile, tachycardic, tachypneic with increased oxygen requirement of 5 L and chest x-ray showing concern for bilateral infiltrate versus atelectasis in addition to cough with recent sick contact (wife with bronchitis) and acute renal failure.  Flu panel, RVP negative given additional GI symptoms, elevated LFTs, thrombocytopenia and high oxygen requirements very high risk for COVID -Continue empiric ceftriaxone, azithromycin for presumed CAP coverage -CO VID PCR pending -Follow blood cultures, strep pneumo and Legionella,  sputum culture   #AKI, improving Baseline 1.27, peak of 2.33 down trended to 1.93.   Patient did have hypotensive blood pressure on arrival with SBP in the 70s so very likely prerenal etiology, still having normal urine output, currently normotensive - Completed timed IV fluids (avoiding long-term fluids given high concern for CO VID which increases risk for ARDS) -MonitorCBMP  #Thrombocytopenia, stable Can be seen in viral infections (CO VID concern).  Slightly anemic with hemoglobin of 12.7 on admission now wnl,, LDH was also sent which was high but acute phase reactant which can be high in setting of sepsis and especially potential COVID infection. Haptoglobin was wnl, doubt hemolytic process -Pending peripheral smear review by pathology -daily CBC w/diff  #Elevated LFTs, slightly improving Alk phos and t bili within normal limits, with only slight abdominal discomfort but otherwise no nausea, no vomiting.  Suspect likely/sepsis physiology and also expected lab abnormality in the setting of potential CO VID infection. Hepatitis panel and HIV were negative. -Trend CMP daily -Discontinued home atorvastatin  #Hypotension,resolved -Completed timed IV fluids on admit day,  blood pressure now quite stable -continue to home home BP medications and closely monitor BP in setting of sepsis  #COVID rule out High risk given extremely high oxygen requirements, cough, shortness of breath, recent sick contact (no known COVID exposure but wife sick with bronchitis), concerning for opacities on chest x-ray -COVID PCR pending,   #T2DM A1c 9.8 Relative hypoglycemia episode x1 on admission, 24 gr range 133-140 -Holding home Lantus ( 100 U),   -CBG closely monitor. hold SSI for now to avoid further hypoglycemia.  #Hypertension Holding home losartan/HCTZ as mentioned above due to AKI    Cultures:  Blood cultures 4/1, urine culture 4/1    DVT prophylaxis: lovenox  Consultants:  PCCM  Procedures:  None  Antimicrobials:  Code Status: Full code  Family Communication: No family at bedside  Disposition Plan: High intubation risk, still on very high amount of oxygen, continue IV ceftriaxone and azithromycin, follow blood cultures, CO VID PCR pending,       Subjective Overnight o2 requirements increased from 5 L to 14 L HFNC He thinks he just had a panic attack Still coughing  Objective: Vitals:   12/12/18 0700 12/12/18 0800 12/12/18 0900 12/12/18 1000  BP:  99/77 (!) 115/92 112/81  Pulse: (!) 128 (!) 122 (!) 115 (!) 121  Resp: (!) 27 (!) 21 (!) 26 (!) 28  Temp:  98.9 F (37.2 C)    TempSrc:  Oral    SpO2: (!) 87% 90% (!) 87% 90%  Weight:      Height:        Intake/Output Summary (Last 24 hours) at 12/12/2018 1029 Last data filed at 12/12/2018 0400 Gross per 24 hour  Intake 850 ml  Output 1650 ml  Net -800 ml   Filed Weights   12/21/2018 2355 12/11/18 0600 12/12/18 0232  Weight: 124.7 kg 127.2 kg 128 kg    Exam:  Constitutional:normal appearing male Eyes: EOMI, anicteric, normal conjunctivae ENMT: Oropharynx with moist mucous membranes,  Cardiovascular: RRR no MRGs, with no peripheral edema Respiratory: Normal respiratory effort on 14 L HF, looks fairly comfortable and able to speak in complete sentences, difficult to appreciate breath sounds with PPE/stethoscope due to precautions Abdomen: Soft,non-tender, non-distended Skin: No rash ulcers, or lesions. Without skin tenting  Neurologic: Grossly no focal neuro deficit. Psychiatric:Appropriate affect, and mood. Mental status AAOx3  Data Reviewed: CBC: Recent Labs  Lab 12/11/18 0040 12/11/18 0520 12/12/18 0438 12/12/18 0517  WBC 9.8 5.8  --  8.2  NEUTROABS 7.8*  --   --  6.0  HGB 15.8 12.1* 14.3 15.2  HCT 47.4 35.5* 42.0 45.1  MCV 87.0 89.0  --  86.7  PLT 117* 93*  --  528*   Basic Metabolic Panel: Recent Labs  Lab 12/11/18 0040 12/11/18 0520 12/12/18 0438 12/12/18 0517  NA 132*   --  133* 136  K 4.4  --  3.0* 2.9*  CL 93*  --   --  97*  CO2 26  --   --  24  GLUCOSE 96  --   --  133*  BUN 25*  --   --  21*  CREATININE 2.23* 2.33*  --  1.93*  CALCIUM 8.4*  --   --  8.2*   GFR: Estimated Creatinine Clearance: 65.6 mL/min (A) (by C-G formula based on SCr of 1.93 mg/dL (H)). Liver Function Tests: Recent Labs  Lab 12/11/18 0040 12/12/18 0517  AST 111* 83*  ALT 45* 33  ALKPHOS 62 54  BILITOT 1.7* 0.8  PROT 6.8 6.2*  ALBUMIN 3.4* 2.9*   No results for input(s): LIPASE, AMYLASE in the last 168 hours. No results for input(s): AMMONIA in the last 168 hours. Coagulation Profile: No results for input(s): INR, PROTIME in the last 168 hours. Cardiac Enzymes: Recent Labs  Lab 12/11/18 0520  TROPONINI <0.03   BNP (last 3 results) No results for input(s): PROBNP in the last 8760 hours. HbA1C: No results for input(s): HGBA1C in the last 72 hours. CBG: Recent Labs  Lab 12/11/18 0812 12/11/18 1130 12/11/18 1543 12/11/18 1945 12/12/18 0943  GLUCAP 94 75 70 106* 140*   Lipid Profile: No results for input(s): CHOL, HDL, LDLCALC, TRIG, CHOLHDL, LDLDIRECT in the  last 72 hours. Thyroid Function Tests: No results for input(s): TSH, T4TOTAL, FREET4, T3FREE, THYROIDAB in the last 72 hours. Anemia Panel: Recent Labs    12/11/18 1510  FERRITIN 930*   Urine analysis:    Component Value Date/Time   COLORURINE YELLOW 12/11/2018 0339   APPEARANCEUR HAZY (A) 12/11/2018 0339   LABSPEC 1.016 12/11/2018 0339   PHURINE 5.0 12/11/2018 0339   GLUCOSEU NEGATIVE 12/11/2018 0339   HGBUR MODERATE (A) 12/11/2018 0339   BILIRUBINUR NEGATIVE 12/11/2018 0339   KETONESUR NEGATIVE 12/11/2018 0339   PROTEINUR 100 (A) 12/11/2018 0339   NITRITE NEGATIVE 12/11/2018 0339   LEUKOCYTESUR NEGATIVE 12/11/2018 0339   Sepsis Labs: '@LABRCNTIP'$ (procalcitonin:4,lacticidven:4)  ) Recent Results (from the past 240 hour(s))  Respiratory Panel by PCR     Status: None   Collection Time:  12/11/18  1:00 AM  Result Value Ref Range Status   Adenovirus NOT DETECTED NOT DETECTED Final   Coronavirus 229E NOT DETECTED NOT DETECTED Final    Comment: (NOTE) The Coronavirus on the Respiratory Panel, DOES NOT test for the novel  Coronavirus (2019 nCoV)    Coronavirus HKU1 NOT DETECTED NOT DETECTED Final   Coronavirus NL63 NOT DETECTED NOT DETECTED Final   Coronavirus OC43 NOT DETECTED NOT DETECTED Final   Metapneumovirus NOT DETECTED NOT DETECTED Final   Rhinovirus / Enterovirus NOT DETECTED NOT DETECTED Final   Influenza A NOT DETECTED NOT DETECTED Final   Influenza B NOT DETECTED NOT DETECTED Final   Parainfluenza Virus 1 NOT DETECTED NOT DETECTED Final   Parainfluenza Virus 2 NOT DETECTED NOT DETECTED Final   Parainfluenza Virus 3 NOT DETECTED NOT DETECTED Final   Parainfluenza Virus 4 NOT DETECTED NOT DETECTED Final   Respiratory Syncytial Virus NOT DETECTED NOT DETECTED Final   Bordetella pertussis NOT DETECTED NOT DETECTED Final   Chlamydophila pneumoniae NOT DETECTED NOT DETECTED Final   Mycoplasma pneumoniae NOT DETECTED NOT DETECTED Final    Comment: Performed at [facility], [place]. [address], [place], [place] [address]  Urine culture     Status: None   Collection Time: 12/11/18  3:18 AM  Result Value Ref Range Status   Specimen Description URINE, RANDOM  Final   Special Requests NONE  Final   Culture   Final    NO GROWTH Performed at [facility], [place] [address], [place], [place] [address]    Report Status 12/12/2018 FINAL  Final  MRSA PCR Screening     Status: None   Collection Time: 12/11/18  6:25 AM  Result Value Ref Range Status   MRSA by PCR NEGATIVE NEGATIVE Final    Comment:        The GeneXpert MRSA Assay (FDA approved for NASAL specimens only), is one component of a comprehensive MRSA colonization surveillance program. It is not intended to diagnose MRSA infection nor to guide or monitor treatment for MRSA infections. Performed at  [facility], [place] [address], [place], [place] [address]       Studies: No results found.  Scheduled Meds: . aspirin  81 mg Oral Daily  . enoxaparin (LOVENOX) injection  40 mg Subcutaneous Q24H  . mouth rinse  15 mL Mouth Rinse BID    Continuous Infusions: . azithromycin 500 mg (12/11/18 2100)  . cefTRIAXone (ROCEPHIN)  IV 1 g (12/11/18 2205)     LOS: 1 day     Desiree Hane, MD [facility]

## 2018-12-12 NOTE — Progress Notes (Addendum)
Initial Nutrition Assessment RD working remotely.  DOCUMENTATION CODES:   Obesity unspecified  INTERVENTION:    Vital High Protein at 65 ml/h (1560 ml per day)  Pro-stat 30 ml BID  Provides 1760 kcal, 167 gm protein, 1304 ml free water daily  Free water flushes 200 ml TID to provide a total of 1904 ml free water daily  NUTRITION DIAGNOSIS:   Inadequate oral intake related to inability to eat as evidenced by NPO status.  GOAL:   Provide needs based on ASPEN/SCCM guidelines  MONITOR:   Vent status, TF tolerance, Labs, I & O's  REASON FOR ASSESSMENT:   Ventilator, Consult Enteral/tube feeding initiation and management  ASSESSMENT:   48 yo male with PMH of DM-2, HTN, HLD who was admitted with CAP, severe sepsis, and COVID-19 rule out. Required intubation and transfer to the ICU on 4/3 for worsening respiratory failure.  Received MD Consult for TF initiation and management. OGT in place.   Patient is currently intubated on ventilator support MV: 10.6 L/min Temp (24hrs), Avg:99.1 F (37.3 C), Min:98.6 F (37 C), Max:100.4 F (38 C)   Labs reviewed. Potassium 2.9 (L) CBG's: 140-158 Medications reviewed and include Plaquenil, Novolog, Precedex.    NUTRITION - FOCUSED PHYSICAL EXAM:  Unable to complete  Diet Order:   Diet Order            Diet NPO time specified  Diet effective now              EDUCATION NEEDS:   No education needs have been identified at this time  Skin:  Skin Assessment: Reviewed RN Assessment  Last BM:  4/3 type 7  Height:   Ht Readings from Last 1 Encounters:  12/11/18 6\' 1"  (1.854 m)    Weight:   Wt Readings from Last 1 Encounters:  12/12/18 128 kg    Ideal Body Weight:  83.6 kg  BMI:  Body mass index is 37.23 kg/m.  Estimated Nutritional Needs:   Kcal:  1400-1800  Protein:  167 gm  Fluid:  2 L    Molli Barrows, RD, LDN, [place] Pager [phone] After Hours Pager [phone]

## 2018-12-12 NOTE — Consult Note (Signed)
NAME:  Charles Eichhorst., MRN:  [id number], DOB:  [birth year], LOS: 1 ADMISSION DATE:  12/21/2018, CONSULTATION DATE:  4/3 REFERRING MD:  netty, CHIEF COMPLAINT:  Acute hypoxic respiratory failure    Brief History   48 year old male admitted 4/2 w/ working dx of CAP, severe sepsis and COVID-19 rule out. PCCM asked to see 4/2 due to worsening respiratory failure and escalating oxygen requirements.   History of present illness   48 year old male admitted 4/1 w/ cc: 3d h/o cough, fever, diarrhea, decreased appetite. Treated w/ out-pt abx, initially better. Then had worsening fever and syncopal event while using commode on 4/1. In ER temp 101. Room air sats 85%. WBC nml, absolute lymph 1.1, RVP neg, influenza PCR neg. CXR w/ bilateral infiltrates. COVID-19 sent. Admitted 4/2 w/ working dx of sepsis, acute hypoxic resp failure (requiring 4 liters) and AKI. Started on CTX and azith. W/ high flow oxygen, and IVFs.  PCCM asked to see on 4/3 as pt having increased oxygen requirements titrated up from 4-5 liters to 14 liters high flow.  Past Medical History  Type II DM, HTN  Significant Hospital Events   4/2 admitted. 4 liters [place] 4/3 increased O2 up to 14 liters high flow. Transferred to ICU w/ PCCM consult   Consults:  pccm 4/3  Procedures:  oett 4/3  Significant Diagnostic Tests:    Micro Data:  RVP 4/2: neg Influenza 4/2: neg UC 4/1: neg N-[place] virus 4/1>>> BCX2 4/1>>>  Antimicrobials:  azith 4/1>>> Rocephin 4/1>>>  Interim history/subjective:  As above  Objective   Blood pressure 112/81, pulse (Abnormal) 121, temperature 98.9 F (37.2 C), temperature source Oral, resp. rate (Abnormal) 28, height 6\' 1"  (1.854 m), weight 128 kg, SpO2 90 %.        Intake/Output Summary (Last 24 hours) at 12/12/2018 1102 Last data filed at 12/12/2018 0800 Gross per 24 hour  Intake 1090 ml  Output 1350 ml  Net -260 ml   Filed Weights   12/30/2018 2355 12/11/18 0600 12/12/18 0232  Weight: 124.7  kg 127.2 kg 128 kg    Examination:  General:  Resting comfortably in bed HENT: NCAT OP clear PULM: Crackles bases B, normal effort CV: RRR, no mgr GI: BS+, soft, nontender MSK: normal bulk and tone Neuro: awake, alert, no distress, MAEW   Resolved Hospital Problem list     Assessment & Plan:   Acute Hypoxic respiratory Failure in setting of bilateral pulmonary infiltrates due to CAP; possible COVID-19 w/ concern for evolving ARDS  PCXR reviewed from 4/2 bilateral pulm infiltrates.  Plan Intubate ARDS protocol PAD protocol RASS goal -2 VAP bundle  Respiratory culture F/u COVID Day 3 rocephin and vanc, consider stopping  Severe sepsis 2/2 above Plan Cont MIVFs abx as above Tele Avoid hypotension Holding out-pt antihypertensives  AKI; appears to have CKD stage at baseline w cr from our records in 1.2 to 1.6. now 1.9 Plan Cont IVFs Avoid hypotension Renal dose meds Strict I&O Am chem  Fluid and electrolyte imbalance: hypokalemia  Plan Replace and recheck   DM w/ hyperglycemia Plan ssi     Best practice  Diet: NPO, assess for tubefeeds Pain/Anxiety/Delirium protocol (if indicated): 4/3 VAP protocol (if indicated): 4/3 DVT prophylaxis: LMWH GI prophylaxis: PPI Glucose control: 4/3 Mobility: BR Code Status: full code  Family Communication: pending  Disposition: critically ill needs intubation/ARDS protocol   Labs   CBC: Recent Labs  Lab 12/11/18 0040 12/11/18 0520 12/12/18 0438 12/12/18  0517  WBC 9.8 5.8  --  8.2  NEUTROABS 7.8*  --   --  6.0  HGB 15.8 12.1* 14.3 15.2  HCT 47.4 35.5* 42.0 45.1  MCV 87.0 89.0  --  86.7  PLT 117* 93*  --  109*    Basic Metabolic Panel: Recent Labs  Lab 12/11/18 0040 12/11/18 0520 12/12/18 0438 12/12/18 0517  NA 132*  --  133* 136  K 4.4  --  3.0* 2.9*  CL 93*  --   --  97*  CO2 26  --   --  24  GLUCOSE 96  --   --  133*  BUN 25*  --   --  21*  CREATININE 2.23* 2.33*  --  1.93*  CALCIUM 8.4*  --    --  8.2*   GFR: Estimated Creatinine Clearance: 65.6 mL/min (A) (by C-G formula based on SCr of 1.93 mg/dL (H)). Recent Labs  Lab 12/11/18 0040 12/11/18 0325 12/11/18 0520 12/12/18 0517  WBC 9.8  --  5.8 8.2  LATICACIDVEN 1.8 1.0  --   --     Liver Function Tests: Recent Labs  Lab 12/11/18 0040 12/12/18 0517  AST 111* 83*  ALT 45* 33  ALKPHOS 62 54  BILITOT 1.7* 0.8  PROT 6.8 6.2*  ALBUMIN 3.4* 2.9*   No results for input(s): LIPASE, AMYLASE in the last 168 hours. No results for input(s): AMMONIA in the last 168 hours.  ABG    Component Value Date/Time   PHART 7.573 (H) 12/12/2018 0438   PCO2ART 31.5 (L) 12/12/2018 0438   PO2ART 71.0 (L) 12/12/2018 0438   HCO3 29.1 (H) 12/12/2018 0438   TCO2 30 12/12/2018 0438   O2SAT 96.0 12/12/2018 0438     Coagulation Profile: No results for input(s): INR, PROTIME in the last 168 hours.  Cardiac Enzymes: Recent Labs  Lab 12/11/18 0520  TROPONINI <0.03    HbA1C: Hemoglobin A1C  Date/Time Value Ref Range Status  10/06/2018 04:16 PM 9.8 (A) 4.0 - 5.6 % Final  07/30/2018 04:29 PM 8.2 (A) 4.0 - 5.6 % Final   Hgb A1c MFr Bld  Date/Time Value Ref Range Status  04/09/2017 08:56 AM 12.6 (H) 4.6 - 6.5 % Final    Comment:    Glycemic Control Guidelines for People with Diabetes:Non Diabetic:  <6%Goal of Therapy: <7%Additional Action Suggested:  >8%   10/19/2016 04:02 PM 12.1 (H) <5.7 % Final    Comment:      For someone without known diabetes, a hemoglobin A1c value of 6.5% or greater indicates that they may have diabetes and this should be confirmed with a follow-up test.   For someone with known diabetes, a value <7% indicates that their diabetes is well controlled and a value greater than or equal to 7% indicates suboptimal control. A1c targets should be individualized based on duration of diabetes, age, comorbid conditions, and other considerations.   Currently, no consensus exists for use of hemoglobin A1c for  diagnosis of diabetes for children.       CBG: Recent Labs  Lab 12/11/18 0812 12/11/18 1130 12/11/18 1543 12/11/18 1945 12/12/18 0943  GLUCAP 94 75 70 106* 140*    Review of Systems:   Not able duet to respiratory distress  Past Medical History  He,  has a past medical history of Diabetes mellitus (2004), Hyperlipidemia (05/16/2012), and Hypertension.   Surgical History    Past Surgical History:  Procedure Laterality Date  . ELBOW FRACTURE SURGERY  as a child   left     Social History   reports that he has never smoked. He has quit using smokeless tobacco.  His smokeless tobacco use included snuff. He reports that he does not drink alcohol or use drugs.   Family History   His family history includes CAD in an other family member; Colon cancer (age of onset: 30) in his mother; Diabetes in his father and mother; Stroke in an other family member. There is no history of Prostate cancer.   Allergies No Known Allergies   Home Medications  Prior to Admission medications   Medication Sig Start Date End Date Taking? Authorizing Provider  amLODipine (NORVASC) 10 MG tablet Take 10 mg by mouth daily. 11/24/18  Yes [provider]  aspirin 81 MG tablet Take 81 mg by mouth daily.   Yes [provider]  atorvastatin (LIPITOR) 20 MG tablet Take 1 tablet (20 mg total) by mouth daily. 11/22/17  Yes Renato Shin, MD  benzonatate (TESSALON) 200 MG capsule Take 200 mg by mouth 3 (three) times daily as needed for cough.  12/09/18 12/16/18 Yes [provider]  brompheniramine-pseudoephedrine-DM 30-2-10 MG/5ML syrup Take 5 mLs by mouth 4 (four) times daily as needed (for cough).  12/09/18 12/19/18 Yes [provider]  doxycycline (VIBRA-TABS) 100 MG tablet Take 100 mg by mouth 2 (two) times daily. 12/09/18 12/16/18 Yes [provider]  Insulin Glargine (LANTUS SOLOSTAR) 100 UNIT/ML Solostar Pen Inject 100 Units into the skin every morning. 10/20/18  Yes  Renato Shin, MD  losartan-hydrochlorothiazide (HYZAAR) 100-25 MG tablet Take 1 tablet by mouth daily. 06/18/18  Yes Paz, Alda Berthold, MD  B-D ULTRAFINE III SHORT PEN 31G X 8 MM MISC USE ONCE A DAY 12/01/18   Renato Shin, MD  glucose blood test strip 1 each by Other route 2 (two) times daily. And lancets 2/day 02/22/16   Renato Shin, MD  [facility] DELICA LANCETS FINE MISC Check blood sugar no more than twice daily. 04/25/15   Colon Branch, MD  [facility] VERIO test strip CHECK BLOOD SUGAR NO MORE THAN TWICE DAILY 02/06/18   Renato Shin, MD     Critical care time: 40 minutes    Roselie Awkward, MD [place] Pager: [phone] Cell: [phone] If no response, call [phone]

## 2018-12-12 NOTE — Progress Notes (Signed)
PCCM Interval Progress Note  Asked to evaluate pt due to vent dyssynchrony with desaturations as well as hypotension.  1.  Pt on ARDS protocol at 6cc/kg (470/25/100/15).  Last ABG 7.40 / 42 / 58.  On review of vent and assess respiratory pattern from door, pt dyssynchronous and using accessory muscles. Currently on 435mcg/hr fentanyl and 1.2 mcg/kg/hr precedex.  2.  Hypotension with BP 70's / 50's.   Plan: - D/c precedex. - 10mg  vecuronium x 1. - Start versed gtt 0 - 5mg /hr for vent synchrony. - PRN versed 0-2mg  q2hrs. - Start phenylephrine for goal MAP > 65. - PICC line placement.   Montey Hora, [facility] Pager: [phone].  If no answer, [phone] 12/12/2018, 4:19 PM

## 2018-12-12 NOTE — Progress Notes (Addendum)
Pt with increased O2 requirement this shift, Fi02 increase from 40% to 76% on HFNC, c/o dyspnea, tachypneic, unlabored, abg alkalotic.  Pt received 0.5mg  Ativan per Dr. Osa Craver.  Will continue to monitor.

## 2018-12-12 NOTE — Procedures (Signed)
Intubation Procedure Note Charles Yoder [id number] [birth year]  Procedure: Intubation Indications: Respiratory insufficiency : called back to bedside emergently, ETT cuff from prior intubation appears to be blown  Procedure Details Consent: Risks of procedure as well as the alternatives and risks of each were explained to the (patient/caregiver).  Consent for procedure obtained. Time Out: Verified patient identification, verified procedure, site/side was marked, verified correct patient position, special equipment/implants available, medications/allergies/relevent history reviewed, required imaging and test results available.  Performed  Drugs Rocuronium 29m IV, precedex infusion, fentanyl/versed injection DL x 1 with MAC 4 blade Grade 4 view 7.5 tube Exchanged using a tube exchanger, procedure went well.  Placement confirmed with bilateral breath sounds, positive EtCO2 change and smoke in tube   Evaluation Hemodynamic Status: BP stable throughout; O2 sats: stable throughout Patient's Current Condition: stable Complications: No apparent complications Patient did tolerate procedure well. Chest X-ray ordered to verify placement.  CXR: pending.   DSimonne Maffucci4/11/2018

## 2018-12-13 ENCOUNTER — Other Ambulatory Visit: Payer: Self-pay | Admitting: Endocrinology

## 2018-12-13 DIAGNOSIS — N179 Acute kidney failure, unspecified: Secondary | ICD-10-CM

## 2018-12-13 DIAGNOSIS — Z9911 Dependence on respirator [ventilator] status: Secondary | ICD-10-CM

## 2018-12-13 LAB — COMPREHENSIVE METABOLIC PANEL
ALT: 30 U/L (ref 0–44)
AST: 80 U/L — ABNORMAL HIGH (ref 15–41)
Albumin: 2.4 g/dL — ABNORMAL LOW (ref 3.5–5.0)
Alkaline Phosphatase: 41 U/L (ref 38–126)
Anion gap: 14 (ref 5–15)
BUN: 45 mg/dL — ABNORMAL HIGH (ref 6–20)
CO2: 22 mmol/L (ref 22–32)
Calcium: 7.2 mg/dL — ABNORMAL LOW (ref 8.9–10.3)
Chloride: 101 mmol/L (ref 98–111)
Creatinine, Ser: 4.5 mg/dL — ABNORMAL HIGH (ref 0.61–1.24)
GFR calc Af Amer: 17 mL/min — ABNORMAL LOW (ref >60)
GFR calc non Af Amer: 14 mL/min — ABNORMAL LOW (ref >60)
Glucose, Bld: 263 mg/dL — ABNORMAL HIGH (ref 70–99)
Potassium: 3.2 mmol/L — ABNORMAL LOW (ref 3.5–5.1)
Sodium: 137 mmol/L (ref 135–145)
Total Bilirubin: 0.5 mg/dL (ref 0.3–1.2)
Total Protein: 5.6 g/dL — ABNORMAL LOW (ref 6.5–8.1)

## 2018-12-13 LAB — MAGNESIUM
Magnesium: 1.9 mg/dL (ref 1.7–2.4)
Magnesium: 2.1 mg/dL (ref 1.7–2.4)

## 2018-12-13 LAB — CBC WITH DIFFERENTIAL/PLATELET
Abs Immature Granulocytes: 0.25 10*3/uL — ABNORMAL HIGH (ref 0.00–0.07)
Basophils Absolute: 0 10*3/uL (ref 0.0–0.1)
Basophils Relative: 0 %
Eosinophils Absolute: 0 10*3/uL (ref 0.0–0.5)
Eosinophils Relative: 0 %
HCT: 43.2 % (ref 39.0–52.0)
Hemoglobin: 14.6 g/dL (ref 13.0–17.0)
Immature Granulocytes: 2 %
Lymphocytes Relative: 13 %
Lymphs Abs: 1.8 10*3/uL (ref 0.7–4.0)
MCH: 30.2 pg (ref 26.0–34.0)
MCHC: 33.8 g/dL (ref 30.0–36.0)
MCV: 89.4 fL (ref 80.0–100.0)
Monocytes Absolute: 0.8 10*3/uL (ref 0.1–1.0)
Monocytes Relative: 6 %
Neutro Abs: 11.2 10*3/uL — ABNORMAL HIGH (ref 1.7–7.7)
Neutrophils Relative %: 79 %
Platelets: 103 10*3/uL — ABNORMAL LOW (ref 150–400)
RBC: 4.83 MIL/uL (ref 4.22–5.81)
RDW: 12.9 % (ref 11.5–15.5)
WBC: 14.2 10*3/uL — ABNORMAL HIGH (ref 4.0–10.5)
nRBC: 0 % (ref 0.0–0.2)

## 2018-12-13 LAB — PHOSPHORUS
Phosphorus: 4.4 mg/dL (ref 2.5–4.6)
Phosphorus: 6.8 mg/dL — ABNORMAL HIGH (ref 2.5–4.6)

## 2018-12-13 LAB — NOVEL CORONAVIRUS, NAA (HOSP ORDER, SEND-OUT TO REF LAB; TAT 18-24 HRS): SARS-CoV-2, NAA: DETECTED — AB

## 2018-12-13 LAB — GLUCOSE, CAPILLARY
Glucose-Capillary: 166 mg/dL — ABNORMAL HIGH (ref 70–99)
Glucose-Capillary: 238 mg/dL — ABNORMAL HIGH (ref 70–99)
Glucose-Capillary: 245 mg/dL — ABNORMAL HIGH (ref 70–99)
Glucose-Capillary: 254 mg/dL — ABNORMAL HIGH (ref 70–99)
Glucose-Capillary: 230 mg/dL — ABNORMAL HIGH (ref 70–99)

## 2018-12-13 MED ORDER — SODIUM CHLORIDE 0.9 % IV SOLN
3.0000 ug/kg/min | INTRAVENOUS | Status: DC
  Administered 2018-12-13 – 2018-12-14 (×2): 3 ug/kg/min via INTRAVENOUS
  Administered 2018-12-14: 08:00:00 2.5 ug/kg/min via INTRAVENOUS
  Administered 2018-12-15: 04:00:00 1 ug/kg/min via INTRAVENOUS
  Filled 2018-12-13 (×5): qty 20

## 2018-12-13 MED ORDER — PANTOPRAZOLE SODIUM 40 MG PO PACK
40.0000 mg | PACK | Freq: Every day | ORAL | Status: DC
  Administered 2018-12-13: 40 mg
  Filled 2018-12-13 (×2): qty 20

## 2018-12-13 MED ORDER — CHLORHEXIDINE GLUCONATE CLOTH 2 % EX PADS
6.0000 | MEDICATED_PAD | Freq: Every day | CUTANEOUS | Status: DC
  Administered 2018-12-14 – 2018-12-16 (×3): 6 via TOPICAL

## 2018-12-13 MED ORDER — VECURONIUM BROMIDE 10 MG IV SOLR
INTRAVENOUS | Status: AC
  Administered 2018-12-13: 5 mg via INTRAVENOUS
  Filled 2018-12-13: qty 10

## 2018-12-13 MED ORDER — ENOXAPARIN SODIUM 80 MG/0.8ML ~~LOC~~ SOLN
65.0000 mg | SUBCUTANEOUS | Status: DC
  Administered 2018-12-13 – 2018-12-14 (×2): 65 mg via SUBCUTANEOUS
  Filled 2018-12-13 (×2): qty 0.65

## 2018-12-13 MED ORDER — SODIUM CHLORIDE 0.9 % IV BOLUS
500.0000 mL | Freq: Once | INTRAVENOUS | Status: AC
  Administered 2018-12-13: 02:00:00 500 mL via INTRAVENOUS

## 2018-12-13 MED ORDER — SODIUM CHLORIDE 0.9% FLUSH
10.0000 mL | Freq: Two times a day (BID) | INTRAVENOUS | Status: DC
  Administered 2018-12-13 (×2): 10 mL
  Administered 2018-12-14: 20 mL
  Administered 2018-12-14 – 2018-12-16 (×5): 10 mL

## 2018-12-13 MED ORDER — ARTIFICIAL TEARS OPHTHALMIC OINT
1.0000 "application " | TOPICAL_OINTMENT | Freq: Three times a day (TID) | OPHTHALMIC | Status: DC
  Administered 2018-12-13 – 2018-12-17 (×11): 1 via OPHTHALMIC
  Filled 2018-12-13: qty 3.5

## 2018-12-13 MED ORDER — TOCILIZUMAB 400 MG/20ML IV SOLN
400.0000 mg | Freq: Once | INTRAVENOUS | Status: AC
  Administered 2018-12-13: 400 mg via INTRAVENOUS
  Filled 2018-12-13 (×2): qty 20

## 2018-12-13 MED ORDER — CISATRACURIUM BOLUS VIA INFUSION
2.5000 mg | Freq: Once | INTRAVENOUS | Status: DC
  Filled 2018-12-13: qty 3

## 2018-12-13 MED ORDER — FENTANYL 2500MCG IN NS 250ML (10MCG/ML) PREMIX INFUSION
100.0000 ug/h | INTRAVENOUS | Status: DC
  Administered 2018-12-14 (×2): 200 ug/h via INTRAVENOUS
  Administered 2018-12-14: 11:00:00 100 ug/h via INTRAVENOUS
  Administered 2018-12-15: 06:00:00 200 ug/h via INTRAVENOUS
  Filled 2018-12-13 (×3): qty 250

## 2018-12-13 MED ORDER — ZINC SULFATE 220 (50 ZN) MG PO CAPS
220.0000 mg | ORAL_CAPSULE | Freq: Every day | ORAL | Status: DC
  Administered 2018-12-13 – 2018-12-16 (×2): 220 mg
  Filled 2018-12-13 (×2): qty 1

## 2018-12-13 MED ORDER — VECURONIUM BROMIDE 10 MG IV SOLR
5.0000 mg | INTRAVENOUS | Status: DC | PRN
  Administered 2018-12-13 (×3): 5 mg via INTRAVENOUS
  Filled 2018-12-13 (×2): qty 10

## 2018-12-13 MED ORDER — MIDAZOLAM 50MG/50ML (1MG/ML) PREMIX INFUSION
2.0000 mg/h | INTRAVENOUS | Status: DC
  Administered 2018-12-14 (×3): 5 mg/h via INTRAVENOUS
  Administered 2018-12-15: 23:00:00 3 mg/h via INTRAVENOUS
  Administered 2018-12-15 – 2018-12-17 (×4): 5 mg/h via INTRAVENOUS
  Filled 2018-12-13 (×8): qty 50

## 2018-12-13 MED ORDER — VECURONIUM BROMIDE 10 MG IV SOLR
5.0000 mg | Freq: Once | INTRAVENOUS | Status: AC
  Administered 2018-12-13: 15:00:00 5 mg via INTRAVENOUS

## 2018-12-13 MED ORDER — SODIUM CHLORIDE 0.9% FLUSH: 10.0000 mL | INTRAVENOUS | Status: DC | PRN

## 2018-12-13 NOTE — Plan of Care (Signed)
  Problem: Respiratory: Goal: Ability to maintain a clear airway and adequate ventilation will improve Outcome: Progressing  Patient does have adequate ventilation and clear airway. However, patient remains on FiO2 100%.    Problem: Safety: Goal: Ability to remain free from injury will improve Outcome: Progressing    Problem: Elimination: Goal: Will not experience complications related to urinary retention Outcome: Not Progressing  Patient has low urinary output. Physician notified. Orders received. See MAR.

## 2018-12-13 NOTE — Progress Notes (Addendum)
NAME:  Davius Goudeau., MRN:  [id number], DOB:  Sep 30, 1970, LOS: 2 ADMISSION DATE:  01/06/2019, CONSULTATION DATE:  4/3 REFERRING MD:  netty, CHIEF COMPLAINT:  Acute hypoxic respiratory failure    Brief History   48 year old male admitted 4/2 with IFI, multifocal pneumonia, intubated on 4/3 for ARDS, COVID-19 POSITIVE   History of present illness    48 year old male admitted 4/1 w/ cc: 3d h/o cough, fever, diarrhea, decreased appetite. Treated w/ out-pt abx, initially better. Then had worsening fever and syncopal event while using commode on 4/1. In ER temp 101. Room air sats 85%. WBC nml, absolute lymph 1.1, RVP neg, influenza PCR neg. CXR w/ bilateral infiltrates. COVID-19 sent. Admitted 4/2 w/ working dx of sepsis, acute hypoxic resp failure (requiring 4 liters) and AKI. Started on CTX and azith. W/ high flow oxygen, and IVFs. PCCM asked to see on 4/3 as pt having increased oxygen requirements titrated up from 4-5 liters to 14 liters high flow.   Past Medical History  Type II DM, HTN  Significant Hospital Events   4/2 admitted. 4 liters [place] 4/3 increased O2 up to 14 liters high flow. Transferred to ICU w/ PCCM consult  4/4: Actemra X 1  Consults:  pccm 4/3  Procedures:  ETT 4/3  Significant Diagnostic Tests:  COVID-19 POSITIVE   Micro Data:  RVP 4/2: neg Influenza 4/2: neg UC 4/1: neg N-[place] virus 4/1>>> POSITIVE  BCX2 4/1>>>NTDG   Antimicrobials:  azith 4/1>>> Rocephin 4/1>>>  Interim history/subjective:  Continued to decline overnight. Remains intubated on mechanical ventilation. Plans for proning today   Objective   Blood pressure 105/78, pulse (!) 130, temperature 98.9 F (37.2 C), temperature source Oral, resp. rate (!) 23, height 6\' 1"  (1.854 m), weight 130.3 kg, SpO2 94 %.    Vent Mode: PRVC FiO2 (%):  [100 %] 100 % Set Rate:  [25 bmp] 25 bmp Vt Set:  [470 mL] 470 mL PEEP:  [10 cmH20-15 cmH20] 15 cmH20 Plateau Pressure:  [19 cmH20-21 cmH20] 21 cmH20   Intake/Output Summary (Last 24 hours) at 12/13/2018 0844 Last data filed at 12/13/2018 0700 Gross per 24 hour  Intake 4163.41 ml  Output 870 ml  Net 3293.41 ml   Filed Weights   12/11/18 0600 12/12/18 0232 12/13/18 0500  Weight: 127.2 kg 128 kg 130.3 kg    Examination:  General:  Intubated, sedated on mechanical ventilation  HENT: NCAT, sclera clear  PULM: BL vented breath sounds, crackles  CV: RRR, no mrg  GI: BS+, soft, nt, nd  MSK: normal; bulk and tone  Neuro: sedated, unresponsive    Resolved Hospital Problem list     Assessment & Plan:   Acute Hypoxic respiratory Failure, ARDS secondary to COVID-19  CXR reviewed - BL infiltrates, The patient's images have been independently reviewed by me.   Plan Remains intubated in unit  ARDS protocol  LTVV  PAD for sedation  Plan to prone today following PICC Line placement   Septic shock 2/2 above   Plan Abx as above On tele  Titrate vasopressors to maintain MAP >34mmHg   ARF, AKI on CKD presumed baseline  Plan Increased Scr  No indication for dialysis at this time Will continue to observe  Repeat CMP in the AM  Will consult nephrology   Fluid and electrolyte imbalance: hypokalemia  Plan Replace as needed Holding off at this time due to progressive renal failure Will recheck on next bmp   DM w/  hyperglycemia Plan Continue SSI    Best practice  Diet: NPO, assess for tubefeeds Pain/Anxiety/Delirium protocol (if indicated): 4/3 VAP protocol (if indicated): 4/3 DVT prophylaxis: LMWH GI prophylaxis: PPI Glucose control: 4/3 Mobility: BR Code Status: full code  Family Communication: pending  Disposition: critically ill needs intubation/ARDS protocol   Labs   CBC: Recent Labs  Lab 12/11/18 0040 12/11/18 0520 12/12/18 0438 12/12/18 0517 12/12/18 1516 12/13/18 0541  WBC 9.8 5.8  --  8.2  --  14.2*  NEUTROABS 7.8*  --   --  6.0  --  11.2*  HGB 15.8 12.1* 14.3 15.2 15.3 14.6  HCT 47.4 35.5* 42.0 45.1  45.0 43.2  MCV 87.0 89.0  --  86.7  --  89.4  PLT 117* 93*  --  109*  --  103*    Basic Metabolic Panel: Recent Labs  Lab 12/11/18 0040 12/11/18 0520 12/12/18 0438 12/12/18 0517 12/12/18 1231 12/12/18 1516 12/12/18 1700  NA 132*  --  133* 136  --  135  --   K 4.4  --  3.0* 2.9*  --  3.4*  --   CL 93*  --   --  97*  --   --   --   CO2 26  --   --  24  --   --   --   GLUCOSE 96  --   --  133*  --   --   --   BUN 25*  --   --  21*  --   --   --   CREATININE 2.23* 2.33*  --  1.93*  --   --   --   CALCIUM 8.4*  --   --  8.2*  --   --   --   MG  --   --   --   --  2.2  --  2.2  PHOS  --   --   --   --  3.6  --  5.5*   GFR: Estimated Creatinine Clearance: 66.3 mL/min (A) (by C-G formula based on SCr of 1.93 mg/dL (H)). Recent Labs  Lab 12/11/18 0040 12/11/18 0325 12/11/18 0520 12/12/18 0517 12/13/18 0541  WBC 9.8  --  5.8 8.2 14.2*  LATICACIDVEN 1.8 1.0  --   --   --     Liver Function Tests: Recent Labs  Lab 12/11/18 0040 12/12/18 0517  AST 111* 83*  ALT 45* 33  ALKPHOS 62 54  BILITOT 1.7* 0.8  PROT 6.8 6.2*  ALBUMIN 3.4* 2.9*   No results for input(s): LIPASE, AMYLASE in the last 168 hours. No results for input(s): AMMONIA in the last 168 hours.  ABG    Component Value Date/Time   PHART 7.401 12/12/2018 1516   PCO2ART 42.9 12/12/2018 1516   PO2ART 58.0 (L) 12/12/2018 1516   HCO3 26.4 12/12/2018 1516   TCO2 28 12/12/2018 1516   O2SAT 88.0 12/12/2018 1516     Coagulation Profile: No results for input(s): INR, PROTIME in the last 168 hours.  Cardiac Enzymes: Recent Labs  Lab 12/11/18 0520  TROPONINI <0.03    HbA1C: Hemoglobin A1C  Date/Time Value Ref Range Status  10/06/2018 04:16 PM 9.8 (A) 4.0 - 5.6 % Final  07/30/2018 04:29 PM 8.2 (A) 4.0 - 5.6 % Final   Hgb A1c MFr Bld  Date/Time Value Ref Range Status  04/09/2017 08:56 AM 12.6 (H) 4.6 - 6.5 % Final    Comment:    Glycemic Control  Guidelines for People with Diabetes:Non Diabetic:   <6%Goal of Therapy: <7%Additional Action Suggested:  >8%   10/19/2016 04:02 PM 12.1 (H) <5.7 % Final    Comment:      For someone without known diabetes, a hemoglobin A1c value of 6.5% or greater indicates that they may have diabetes and this should be confirmed with a follow-up test.   For someone with known diabetes, a value <7% indicates that their diabetes is well controlled and a value greater than or equal to 7% indicates suboptimal control. A1c targets should be individualized based on duration of diabetes, age, comorbid conditions, and other considerations.   Currently, no consensus exists for use of hemoglobin A1c for diagnosis of diabetes for children.       CBG: Recent Labs  Lab 12/12/18 1639 12/12/18 1958 12/12/18 2312 12/13/18 0341 12/13/18 0812  GLUCAP 202* 148* 114* 166* 238*    This patient is critically ill with multiple organ system failure; which, requires frequent high complexity decision making, assessment, support, evaluation, and titration of therapies. This was completed through the application of advanced monitoring technologies and extensive interpretation of multiple databases. During this encounter critical care time was devoted to patient care services described in this note for 36 minutes.   Garner Nash, DO [facility] 12/13/2018 9:57 AM  Personal pager: [phone] If unanswered, please page CCM On-call: [phone]

## 2018-12-13 NOTE — Progress Notes (Addendum)
CRITICAL VALUE ALERT  Critical Value:  COVID positive.  Date & Time Notied: 12/13/2018, 0145  Provider Notified: Warren Lacy nurse notified.   Orders Received/Actions taken: Waiting for possible orders.   Low urinary output also reported. Orders received. See MAR.

## 2018-12-13 NOTE — Consult Note (Signed)
Renal Service Consult Note [facility]  Charles Yoder. 12/13/2018 Sol Blazing Requesting Physician:  Dr Lake Bells  Reason for Consult:  Acute renal failure HPI: The patient is a 48 y.o. year-old w h/o DM2 on insulin, HTN and CKD III admitted w/ 3 day illness cough/ fever, diarrhea then syncope on 4/1.  In ED 4/2 RA sats wer 85%, abs lymph 1.1 WBC wnl. CXR bilat infiltrates, flu neg PCR. Admitted and started on IV abx. Is now intubated, COVID-19 +.  Creat up to 4.5 today , was 2.3 on admission. Making urine 645 cc today. Asked to see for renal failure.    UA 4/2  5-10 wbc/ rbc, 100 prot.  100% fiO2 on the vent, neo gtt 100 ug/ min this am, weaned down and off now. proned on vent today.   Baseline creat  2009- 2018 > 1.1- 1.13 Jun 2018 > 1.64, eGFR 56   ROS  n/a   Past Medical History  Past Medical History:  Diagnosis Date  . Diabetes mellitus 2004   type 2  . Hyperlipidemia 05/16/2012  . Hypertension    Past Surgical History  Past Surgical History:  Procedure Laterality Date  . ELBOW FRACTURE SURGERY  as a child   left   Family History  Family History  Problem Relation Age of Onset  . Colon cancer Mother 26  . Diabetes Mother   . Diabetes Father   . Stroke Other        aunt  . CAD Other        GM, late onset  . Prostate cancer Neg Hx    Social History  reports that he has never smoked. He has quit using smokeless tobacco.  His smokeless tobacco use included snuff. He reports that he does not drink alcohol or use drugs. Allergies No Known Allergies Home medications Prior to Admission medications   Medication Sig Start Date End Date Taking? Authorizing Provider  amLODipine (NORVASC) 10 MG tablet Take 10 mg by mouth daily. 11/24/18  Yes [provider]  aspirin 81 MG tablet Take 81 mg by mouth daily.   Yes [provider]  atorvastatin (LIPITOR) 20 MG tablet Take 1 tablet (20 mg total) by mouth daily. 11/22/17  Yes Renato Shin, MD   benzonatate (TESSALON) 200 MG capsule Take 200 mg by mouth 3 (three) times daily as needed for cough.  12/09/18 12/16/18 Yes [provider]  brompheniramine-pseudoephedrine-DM 30-2-10 MG/5ML syrup Take 5 mLs by mouth 4 (four) times daily as needed (for cough).  12/09/18 12/19/18 Yes [provider]  doxycycline (VIBRA-TABS) 100 MG tablet Take 100 mg by mouth 2 (two) times daily. 12/09/18 12/16/18 Yes [provider]  Insulin Glargine (LANTUS SOLOSTAR) 100 UNIT/ML Solostar Pen Inject 100 Units into the skin every morning. 10/20/18  Yes Renato Shin, MD  losartan-hydrochlorothiazide (HYZAAR) 100-25 MG tablet Take 1 tablet by mouth daily. 06/18/18  Yes Paz, Alda Berthold, MD  B-D ULTRAFINE III SHORT PEN 31G X 8 MM MISC USE ONCE A DAY 12/01/18   Renato Shin, MD  glucose blood test strip 1 each by Other route 2 (two) times daily. And lancets 2/day 02/22/16   Renato Shin, MD  [facility] DELICA LANCETS FINE MISC Check blood sugar no more than twice daily. 04/25/15   Colon Branch, MD  [facility] VERIO test strip CHECK BLOOD SUGAR NO MORE THAN TWICE DAILY 02/06/18   Renato Shin, MD   Liver Function Tests Recent Labs  Lab 12/11/18 0040 12/12/18 0517 12/13/18 0814  AST 111* 83* 80*  ALT 45* 33 30  ALKPHOS 62 54 41  BILITOT 1.7* 0.8 0.5  PROT 6.8 6.2* 5.6*  ALBUMIN 3.4* 2.9* 2.4*   No results for input(s): LIPASE, AMYLASE in the last 168 hours. CBC Recent Labs  Lab 12/11/18 0040 12/11/18 0520  12/12/18 0517 12/12/18 1516 12/13/18 0541  WBC 9.8 5.8  --  8.2  --  14.2*  NEUTROABS 7.8*  --   --  6.0  --  11.2*  HGB 15.8 12.1*   < > 15.2 15.3 14.6  HCT 47.4 35.5*   < > 45.1 45.0 43.2  MCV 87.0 89.0  --  86.7  --  89.4  PLT 117* 93*  --  109*  --  103*   < > = values in this interval not displayed.   Basic Metabolic Panel Recent Labs  Lab 12/11/18 0040 12/11/18 0520 12/12/18 0438 12/12/18 0517 12/12/18 1231 12/12/18 1516 12/12/18 1700 12/13/18 0814  NA 132*  --  133* 136   --  135  --  137  K 4.4  --  3.0* 2.9*  --  3.4*  --  3.2*  CL 93*  --   --  97*  --   --   --  101  CO2 26  --   --  24  --   --   --  22  GLUCOSE 96  --   --  133*  --   --   --  263*  BUN 25*  --   --  21*  --   --   --  45*  CREATININE 2.23* 2.33*  --  1.93*  --   --   --  4.50*  CALCIUM 8.4*  --   --  8.2*  --   --   --  7.2*  PHOS  --   --   --   --  3.6  --  5.5* 4.4   Iron/TIBC/Ferritin/ %Sat    Component Value Date/Time   FERRITIN 930 (H) 12/11/2018 1510    Vitals:   12/13/18 1515 12/13/18 1530 12/13/18 1532 12/13/18 1545  BP: 104/76 108/73  121/78  Pulse: (!) 141 (!) 145  (!) 145  Resp: (!) 27 (!) 0  (!) 32  Temp:   98.9 F (37.2 C)   TempSrc:   Oral   SpO2: (!) 89% 90%  (!) 87%  Weight:      Height:       Exam:  Patient not examined directly as recommended given COVID-19 pending or + status, utilizing exam of the primary team CCM.    Home meds:  - amlodipine 10 / losartan-HCT 100- 25 qd  - aspirin 81 qd/ atorvastatin 20 qd  - inslulin glargine 100 u qd   Na 137  K 3.2  CO2 22  BUN 45  Cr 4.50  Ca 7.2   Alb 2.4  AST 80/ Alt 30  wBC 14k   plt 103  7.4/ 42/ 58  UA 4/2  5-10 wbc/ rbc, 100 prot.   CXR bilat infiltrates     Assessment/ Plan: 1. Acute on CKDIII - creat 4.5 , making urine, in setting of ARDS/ COVID+ resp infection.  Baseline creat 1.6 from 2019. No indication for RRT at this time. Will follow.  2. Septic shock - pressors weaning down today 3. Acute resp failure- COVID+ w ARDS, on 100 FiO2 4. Morbid obesity  5. DM2 on insulin 6. H/o HTN 7. H/o HL      [facility] [employer] 12/13/2018, 4:27 PM

## 2018-12-13 NOTE — Progress Notes (Signed)
Peripherally Inserted Central Catheter/Midline Placement  The IV Nurse has discussed with the patient and/or persons authorized to consent for the patient, the purpose of this procedure and the potential benefits and risks involved with this procedure.  The benefits include less needle sticks, lab draws from the catheter, and the patient may be discharged home with the catheter. Risks include, but not limited to, infection, bleeding, blood clot (thrombus formation), and puncture of an artery; nerve damage and irregular heartbeat and possibility to perform a PICC exchange if needed/ordered by physician.  Alternatives to this procedure were also discussed.  Bard Power PICC patient education guide, fact sheet on infection prevention and patient information card has been provided to patient /or left at bedside.  Telephone consent obtained from wife by [facility].  PICC/Midline Placement Documentation  PICC Triple Lumen 12/13/18 PICC Right Brachial 50 cm 0 cm (Active)  Indication for Insertion or Continuance of Line Vasoactive infusions 12/13/2018 10:02 AM  Exposed Catheter (cm) 0 cm 12/13/2018 10:02 AM  Site Assessment Clean;Dry;Intact 12/13/2018 10:02 AM  Lumen #1 Status Flushed;Saline locked;Blood return noted 12/13/2018 10:02 AM  Lumen #2 Status Flushed;Saline locked;Blood return noted 12/13/2018 10:02 AM  Lumen #3 Status Flushed;Saline locked;Blood return noted 12/13/2018 10:02 AM  Dressing Type Transparent 12/13/2018 10:02 AM  Dressing Status Clean;Dry;Intact;Antimicrobial disc in place 12/13/2018 10:02 AM  Line Care Connections checked and tightened 12/13/2018 10:02 AM  Line Adjustment (NICU/IV Team Only) No 12/13/2018 10:02 AM  Dressing Intervention New dressing 12/13/2018 10:02 AM  Dressing Change Due 12/20/18 12/13/2018 10:02 AM       Rolena Infante 12/13/2018, 10:03 AM

## 2018-12-14 ENCOUNTER — Inpatient Hospital Stay (HOSPITAL_COMMUNITY): Payer: BLUE CROSS/BLUE SHIELD

## 2018-12-14 DIAGNOSIS — R579 Shock, unspecified: Secondary | ICD-10-CM

## 2018-12-14 DIAGNOSIS — E119 Type 2 diabetes mellitus without complications: Secondary | ICD-10-CM

## 2018-12-14 DIAGNOSIS — A419 Sepsis, unspecified organism: Secondary | ICD-10-CM

## 2018-12-14 DIAGNOSIS — I10 Essential (primary) hypertension: Secondary | ICD-10-CM

## 2018-12-14 DIAGNOSIS — R6520 Severe sepsis without septic shock: Secondary | ICD-10-CM

## 2018-12-14 LAB — POCT I-STAT 7, (LYTES, BLD GAS, ICA,H+H)
Acid-base deficit: 8 mmol/L — ABNORMAL HIGH (ref 0.0–2.0)
Acid-base deficit: 10 mmol/L — ABNORMAL HIGH (ref 0.0–2.0)
Bicarbonate: 22 mmol/L (ref 20.0–28.0)
Bicarbonate: 19.4 mmol/L — ABNORMAL LOW (ref 20.0–28.0)
Calcium, Ion: 1.09 mmol/L — ABNORMAL LOW (ref 1.15–1.40)
Calcium, Ion: 1.01 mmol/L — ABNORMAL LOW (ref 1.15–1.40)
HCT: 46 % (ref 39.0–52.0)
HCT: 44 % (ref 39.0–52.0)
Hemoglobin: 15.6 g/dL (ref 13.0–17.0)
Hemoglobin: 15 g/dL (ref 13.0–17.0)
O2 Saturation: 71 %
O2 Saturation: 81 %
Patient temperature: 102.2
Patient temperature: 98.6
Potassium: 3.7 mmol/L (ref 3.5–5.1)
Potassium: 4.8 mmol/L (ref 3.5–5.1)
Sodium: 140 mmol/L (ref 135–145)
Sodium: 137 mmol/L (ref 135–145)
TCO2: 24 mmol/L (ref 22–32)
TCO2: 21 mmol/L — ABNORMAL LOW (ref 22–32)
pCO2 arterial: 67.9 mmHg (ref 32.0–48.0)
pCO2 arterial: 55.9 mmHg — ABNORMAL HIGH (ref 32.0–48.0)
pH, Arterial: 7.131 — CL (ref 7.350–7.450)
pH, Arterial: 7.147 — CL (ref 7.350–7.450)
pO2, Arterial: 56 mmHg — ABNORMAL LOW (ref 83.0–108.0)
pO2, Arterial: 59 mmHg — ABNORMAL LOW (ref 83.0–108.0)

## 2018-12-14 LAB — COMPREHENSIVE METABOLIC PANEL
ALT: 32 U/L (ref 0–44)
AST: 96 U/L — ABNORMAL HIGH (ref 15–41)
Albumin: 2.1 g/dL — ABNORMAL LOW (ref 3.5–5.0)
Alkaline Phosphatase: 45 U/L (ref 38–126)
Anion gap: 15 (ref 5–15)
BUN: 61 mg/dL — ABNORMAL HIGH (ref 6–20)
CO2: 23 mmol/L (ref 22–32)
Calcium: 7.3 mg/dL — ABNORMAL LOW (ref 8.9–10.3)
Chloride: 102 mmol/L (ref 98–111)
Creatinine, Ser: 6.86 mg/dL — ABNORMAL HIGH (ref 0.61–1.24)
GFR calc Af Amer: 10 mL/min — ABNORMAL LOW (ref >60)
GFR calc non Af Amer: 9 mL/min — ABNORMAL LOW (ref >60)
Glucose, Bld: 138 mg/dL — ABNORMAL HIGH (ref 70–99)
Potassium: 3.7 mmol/L (ref 3.5–5.1)
Sodium: 140 mmol/L (ref 135–145)
Total Bilirubin: 0.5 mg/dL (ref 0.3–1.2)
Total Protein: 5.5 g/dL — ABNORMAL LOW (ref 6.5–8.1)

## 2018-12-14 LAB — GLUCOSE, CAPILLARY
Glucose-Capillary: 122 mg/dL — ABNORMAL HIGH (ref 70–99)
Glucose-Capillary: 130 mg/dL — ABNORMAL HIGH (ref 70–99)
Glucose-Capillary: 150 mg/dL — ABNORMAL HIGH (ref 70–99)
Glucose-Capillary: 173 mg/dL — ABNORMAL HIGH (ref 70–99)
Glucose-Capillary: 224 mg/dL — ABNORMAL HIGH (ref 70–99)
Glucose-Capillary: 270 mg/dL — ABNORMAL HIGH (ref 70–99)
Glucose-Capillary: 282 mg/dL — ABNORMAL HIGH (ref 70–99)

## 2018-12-14 LAB — CULTURE, RESPIRATORY W GRAM STAIN
Culture: NO GROWTH
Special Requests: NORMAL

## 2018-12-14 LAB — CBC WITH DIFFERENTIAL/PLATELET
Abs Immature Granulocytes: 0.22 10*3/uL — ABNORMAL HIGH (ref 0.00–0.07)
Basophils Absolute: 0 10*3/uL (ref 0.0–0.1)
Basophils Relative: 0 %
Eosinophils Absolute: 0 10*3/uL (ref 0.0–0.5)
Eosinophils Relative: 0 %
HCT: 50.1 % (ref 39.0–52.0)
Hemoglobin: 15.6 g/dL (ref 13.0–17.0)
Immature Granulocytes: 2 %
Lymphocytes Relative: 14 %
Lymphs Abs: 1.5 10*3/uL (ref 0.7–4.0)
MCH: 28.7 pg (ref 26.0–34.0)
MCHC: 31.1 g/dL (ref 30.0–36.0)
MCV: 92.1 fL (ref 80.0–100.0)
Monocytes Absolute: 0.4 10*3/uL (ref 0.1–1.0)
Monocytes Relative: 4 %
Neutro Abs: 8.6 10*3/uL — ABNORMAL HIGH (ref 1.7–7.7)
Neutrophils Relative %: 80 %
Platelets: 118 10*3/uL — ABNORMAL LOW (ref 150–400)
RBC: 5.44 MIL/uL (ref 4.22–5.81)
RDW: 13.3 % (ref 11.5–15.5)
WBC: 10.7 10*3/uL — ABNORMAL HIGH (ref 4.0–10.5)
nRBC: 0 % (ref 0.0–0.2)

## 2018-12-14 LAB — PROTIME-INR
INR: 1.3 — ABNORMAL HIGH (ref 0.8–1.2)
Prothrombin Time: 15.6 seconds — ABNORMAL HIGH (ref 11.4–15.2)

## 2018-12-14 MED ORDER — FUROSEMIDE 10 MG/ML IJ SOLN
10.0000 mg/h | INTRAVENOUS | Status: DC
  Administered 2018-12-14 – 2018-12-15 (×2): 10 mg/h via INTRAVENOUS
  Filled 2018-12-14 (×2): qty 25

## 2018-12-14 MED ORDER — PANTOPRAZOLE SODIUM 40 MG IV SOLR
40.0000 mg | INTRAVENOUS | Status: DC
  Administered 2018-12-14 – 2018-12-16 (×3): 40 mg via INTRAVENOUS
  Filled 2018-12-14 (×4): qty 40

## 2018-12-14 MED ORDER — VASOPRESSIN 20 UNIT/ML IV SOLN
0.0300 [IU]/min | INTRAVENOUS | Status: DC
  Administered 2018-12-14 – 2018-12-17 (×5): 0.03 [IU]/min via INTRAVENOUS
  Filled 2018-12-14 (×4): qty 2

## 2018-12-14 MED ORDER — ASPIRIN 300 MG RE SUPP: 150.0000 mg | Freq: Every day | RECTAL | Status: DC

## 2018-12-14 MED ORDER — SODIUM BICARBONATE 8.4 % IV SOLN
INTRAVENOUS | Status: AC
  Filled 2018-12-14: qty 50

## 2018-12-14 MED ORDER — FUROSEMIDE 10 MG/ML IJ SOLN
160.0000 mg | Freq: Once | INTRAVENOUS | Status: AC
  Administered 2018-12-14: 160 mg via INTRAVENOUS
  Filled 2018-12-14: qty 2

## 2018-12-14 MED ORDER — SODIUM BICARBONATE 8.4 % IV SOLN
50.0000 meq | Freq: Once | INTRAVENOUS | Status: AC
  Administered 2018-12-14: 50 meq via INTRAVENOUS

## 2018-12-14 MED ORDER — SODIUM BICARBONATE 8.4 % IV SOLN: 100.0000 meq | Freq: Once | INTRAVENOUS | Status: DC

## 2018-12-14 MED ORDER — NOREPINEPHRINE 4 MG/250ML-% IV SOLN
0.0000 ug/min | INTRAVENOUS | Status: DC
  Administered 2018-12-14: 01:00:00 3 ug/min via INTRAVENOUS
  Administered 2018-12-14 – 2018-12-15 (×4): 10 ug/min via INTRAVENOUS
  Filled 2018-12-14 (×4): qty 250

## 2018-12-14 MED ORDER — FUROSEMIDE 10 MG/ML IJ SOLN
80.0000 mg | Freq: Once | INTRAMUSCULAR | Status: AC
  Administered 2018-12-14: 02:00:00 80 mg via INTRAVENOUS
  Filled 2018-12-14: qty 8

## 2018-12-14 NOTE — Progress Notes (Signed)
RT NOTE: RT obtained ABG and critical results reported to Icard, MD. ABG as follows: pH 7.14, CO2 55.9, PO2 59, HCO3 19.4.  No changes to vent at this time per MD. RT will continue to monitor.

## 2018-12-14 NOTE — Progress Notes (Signed)
[facility] Progress Note  Subjective: on levo/ vaso gtt's now, neo gtt dc'd.  ABG today 7.13/ 68/ 56. 100% FiO2 w/ 79- 86% spO2. Wt's stable at 130.  Creat up 6.5. UOP 745 cc yesterday. I/O net +2.7 L yest.    Vitals:   12/14/18 0800 12/14/18 0900 12/14/18 0918 12/14/18 1000  BP: 96/72 (!) 89/65  91/63  Pulse: (!) 143     Resp: (!) 32 (!) 28 (!) 35 (!) 35  Temp: 100.3 F (37.9 C)     TempSrc: Oral     SpO2: (!) 87%  (!) 79%   Weight:      Height:        Inpatient medications: . artificial tears  1 application Both Eyes K9F  . aspirin  150 mg Rectal Daily  . chlorhexidine gluconate (MEDLINE KIT)  15 mL Mouth Rinse BID  . Chlorhexidine Gluconate Cloth  6 each Topical Daily  . enoxaparin (LOVENOX) injection  65 mg Subcutaneous Q24H  . feeding supplement (PRO-STAT SUGAR FREE 64)  30 mL Per Tube BID  . hydroxychloroquine  200 mg Oral BID  . insulin aspart  0-20 Units Subcutaneous Q4H  . mouth rinse  15 mL Mouth Rinse 10 times per day  . pantoprazole (PROTONIX) IV  40 mg Intravenous Q24H  . sodium chloride flush  10-40 mL Intracatheter Q12H  . zinc sulfate  220 mg Per Tube Daily   . azithromycin Stopped (12/13/18 2206)  . cisatracurium (NIMBEX) infusion Stopped (12/14/18 0846)  . feeding supplement (VITAL HIGH PROTEIN) Stopped (12/13/18 1312)  . fentaNYL infusion INTRAVENOUS 100 mcg/hr (12/14/18 1000)  . midazolam Stopped (12/14/18 0846)  . norepinephrine (LEVOPHED) Adult infusion 10 mcg/min (12/14/18 1000)  . vasopressin (PITRESSIN) infusion - *FOR SHOCK* 0.03 Units/min (12/14/18 1000)   acetaminophen **OR** acetaminophen, albuterol, fentaNYL (SUBLIMAZE) injection, ipratropium, midazolam, sodium chloride flush    Exam:  Patient not examined directly as recommended given COVID-19 pending or + status, utilizing exam of the primary team CCM.    Home meds:  - amlodipine 10 / losartan-HCT 100- 25 qd  - aspirin 81 qd/ atorvastatin 20 qd  - inslulin glargine 100  u qd   Na 137  K 3.2  CO2 22  BUN 45  Cr 4.50  Ca 7.2   Alb 2.4  AST 80/ Alt 30  wBC 14k   plt 103  7.4/ 42/ 58  UA 4/2  5-10 wbc/ rbc, 100 prot.   CXR worsening dense L sided infiltrates, persistent lesser R side infiltrates   Assessment/ Plan: 1. Acute on CKDIII - creat rising 6.8 , making urine, in setting of ARDS/ COVID+ resp infection.  Baseline creat 1.6 from 2019.  No indication for RRT at this time.  Will follow.  2. Shock - presumed septic, on levo/ vaso gtt's today 3. Acute resp failure- COVID+ w ARDS, on 100 FiO2 w/ poor SpO2 4. Morbid obesity 5. DM2 on insulin 6. H/o HTN 7. H/o HL   [facility] 12/14/2018, 10:34 AM  Iron/TIBC/Ferritin/ %Sat    Component Value Date/Time   FERRITIN 930 (H) 12/11/2018 1510   Recent Labs  Lab 12/13/18 1635  12/14/18 0342  NA  --    < > 140  K  --    < > 3.7  CL  --   --  102  CO2  --   --  23  GLUCOSE  --   --  138*  BUN  --   --  61*  CREATININE  --   --  6.86*  CALCIUM  --   --  7.3*  PHOS 6.8*  --   --   ALBUMIN  --   --  2.1*   < > = values in this interval not displayed.   Recent Labs  Lab 12/14/18 0342  AST 96*  ALT 32  ALKPHOS 45  BILITOT 0.5  PROT 5.5*   Recent Labs  Lab 12/14/18 0342  WBC 10.7*  HGB 15.6  HCT 50.1  PLT 118*

## 2018-12-14 NOTE — Progress Notes (Signed)
  Subjective: Patient examined, medical record reviewed, x-ray images personally reviewed and patient discussed in medical ICU with Dr. Valeta Harms for coordination of care.  Asked to evaluate patient for ECLS support for viral pneumonitis.  Patient is a 48 year old morbidly obese diabetic who is intubated 3 days ago for acute respiratory failure.  The patient now has sepsis with multiorgan failure, anuric with creatinine 7 on high-dose norepinephrine and vasopressin.  The patient is deteriorating despite maximal medical therapy.  I feel that application of ECMO to a septic 300 pound patient with multiorgan failure  has no chance of benefiting the patient.  For that reason I would not recommend initiating ECMO therapy on this patient.  Objective: Vital signs in last 24 hours: Temp:  [98.9 F (37.2 C)-102.5 F (39.2 C)] 99.2 F (37.3 C) (04/05 1200) Pulse Rate:  [128-152] 133 (04/05 1313) Cardiac Rhythm: Sinus tachycardia (04/05 1200) Resp:  [0-35] 35 (04/05 1400) BP: (72-121)/(48-78) 94/61 (04/05 1400) SpO2:  [77 %-91 %] 80 % (04/05 1313) FiO2 (%):  [100 %] 100 % (04/05 1313) Weight:  [130.1 kg] 130.1 kg (04/05 0356)  Hemodynamic parameters for last 24 hours:    Intake/Output from previous day: 04/04 0701 - 04/05 0700 In: 3486.2 [I.V.:2771.4; NG/GT:260; IV Piggyback:449.8] Out: 745 [Urine:745] Intake/Output this shift: Total I/O In: 436.5 [I.V.:436.5] Out: -   Patient viewed in the enclosed negative pressure room.  He is supine on 100% FiO2 and PEEP with low mean arterial pressure and oxygen saturation 81%.  Lab Results: Recent Labs    12/13/18 0541 12/14/18 0145 12/14/18 0342  WBC 14.2*  --  10.7*  HGB 14.6 15.6 15.6  HCT 43.2 46.0 50.1  PLT 103*  --  118*   BMET:  Recent Labs    12/13/18 0814 12/14/18 0145 12/14/18 0342  NA 137 140 140  K 3.2* 3.7 3.7  CL 101  --  102  CO2 22  --  23  GLUCOSE 263*  --  138*  BUN 45*  --  61*  CREATININE 4.50*  --  6.86*  CALCIUM  7.2*  --  7.3*    PT/INR:  Recent Labs    12/14/18 1318  LABPROT 15.6*  INR 1.3*   ABG    Component Value Date/Time   PHART 7.131 (LL) 12/14/2018 0145   HCO3 22.0 12/14/2018 0145   TCO2 24 12/14/2018 0145   ACIDBASEDEF 8.0 (H) 12/14/2018 0145   O2SAT 71.0 12/14/2018 0145   CBG (last 3)  Recent Labs    12/14/18 0346 12/14/18 0827 12/14/18 1253  GLUCAP 130* 122* 173*    Assessment/Plan: S/P Acute respiratory failure for viral pneumonitis Would not recommend ECMO for this patient because he would not benefit from the therapy.  Continue medical therapy.   LOS: 3 days    Tharon Aquas Trigt III 12/14/2018

## 2018-12-14 NOTE — Progress Notes (Signed)
NAME:  Charles Heuring., MRN:  [id number], DOB:  Oct 02, 1970, LOS: 3 ADMISSION DATE:  12/24/2018, CONSULTATION DATE:  4/3 REFERRING MD:  netty, CHIEF COMPLAINT:  Acute hypoxic respiratory failure    Brief History    48 year old male admitted 4/2 with IFI, multifocal pneumonia, intubated on 4/3 for ARDS, COVID-19 POSITIVE   History of present illness    48 year old male admitted 4/1 w/ cc: 3d h/o cough, fever, diarrhea, decreased appetite. Treated w/ out-pt abx, initially better. Then had worsening fever and syncopal event while using commode on 4/1. In ER temp 101. Room air sats 85%. WBC nml, absolute lymph 1.1, RVP neg, influenza PCR neg. CXR w/ bilateral infiltrates. COVID-19 sent. Admitted 4/2 w/ working dx of sepsis, acute hypoxic resp failure (requiring 4 liters) and AKI. Started on CTX and azith. W/ high flow oxygen, and IVFs. PCCM asked to see on 4/3 as pt having increased oxygen requirements titrated up from 4-5 liters to 14 liters high flow.   Past Medical History  Type II DM, HTN  Significant Hospital Events   4/2 admitted. 4 liters [place] 4/3 increased O2 up to 14 liters high flow. Transferred to ICU w/ PCCM consult  4/4: Actemra X 1 4/4: PRONED, iNO started overnight   Consults:  PCCM 4/3  Procedures:  ETT 4/3  Significant Diagnostic Tests:  COVID-19 POSITIVE   Micro Data:  RVP 4/2: neg Influenza 4/2: neg UC 4/1: neg N-Coraona virus 4/1>>> POSITIVE  BCX2 4/1>>>NTDG   Antimicrobials:  azith 4/1>>> Rocephin 4/1>>>  Interim history/subjective:  Continued decompensation overnight. Increasing o2 requirements. Started continuous paralysis.   Objective   Blood pressure 98/70, pulse (!) 143, temperature (!) 101.3 F (38.5 C), temperature source Oral, resp. rate (!) 32, height 6\' 1"  (1.854 m), weight 130.1 kg, SpO2 (!) 84 %.    Vent Mode: PRVC FiO2 (%):  [85 %-100 %] 100 % Set Rate:  [32 bmp] 32 bmp Vt Set:  [470 mL] 470 mL PEEP:  [18 cmH20] 18 cmH20 Plateau  Pressure:  [27 cmH20-29 cmH20] 29 cmH20   Intake/Output Summary (Last 24 hours) at 12/14/2018 0746 Last data filed at 12/14/2018 0600 Gross per 24 hour  Intake 3392.95 ml  Output 745 ml  Net 2647.95 ml   Filed Weights   12/12/18 0232 12/13/18 0500 12/14/18 0356  Weight: 128 kg 130.3 kg 130.1 kg    Examination:  General: young male, intubated, prone  HENT: NCAT, padding on face  PULM: BL vented breath sounds, ausculated posteriorly, post crackles  CV: regular, tachy, s1 s2, no mrg  GI: unable to examine, patient proned  MSK: wnl, normal bulkt tone  Neuro: sedated, paralyzed    Resolved Hospital Problem list     Assessment & Plan:   Acute Hypoxic respiratory Failure, ARDS secondary to COVID-19  CXR reviewed - new complete white out consolidation of the left and infiltrates in the right  Plan Intubated Proned ARDS protocol  LTVV PAD for sedation  Paralysis continuous with nimbex   Septic shock 2/2 above   Plan Azithromycin  Plaquenil  Titrate pressors to maintain MAP >63mmHg Currently on Nepi and Vaso  ARF, AKI on CKD presumed baseline  Plan No indication for RRT at this time Does have increasing Scr Would be high risk to un-prone at this time   Fluid and electrolyte imbalance: hypokalemia  Plan Replete K as needed We will continue to follow   DM w/ hyperglycemia Plan Continue SSI  Family: I called and updated the patients wife on his poor prognosis and continued decompensation   Best practice  Diet: NPO, assess for tubefeeds Pain/Anxiety/Delirium protocol (if indicated): 4/3 VAP protocol (if indicated): 4/3 DVT prophylaxis: LMWH GI prophylaxis: PPI Glucose control: 4/3 Mobility: BR Code Status: full code  Family Communication: pending  Disposition: critically ill needs intubation/ARDS protocol   Labs    CBC: Recent Labs  Lab 12/11/18 0040 12/11/18 0520  12/12/18 0517 12/12/18 1516 12/13/18 0541 12/14/18 0145 12/14/18 0342  WBC 9.8 5.8   --  8.2  --  14.2*  --  10.7*  NEUTROABS 7.8*  --   --  6.0  --  11.2*  --  8.6*  HGB 15.8 12.1*   < > 15.2 15.3 14.6 15.6 15.6  HCT 47.4 35.5*   < > 45.1 45.0 43.2 46.0 50.1  MCV 87.0 89.0  --  86.7  --  89.4  --  92.1  PLT 117* 93*  --  109*  --  103*  --  118*   < > = values in this interval not displayed.    Basic Metabolic Panel: Recent Labs  Lab 12/11/18 0040 12/11/18 0520  12/12/18 0517 12/12/18 1231 12/12/18 1516 12/12/18 1700 12/13/18 0814 12/13/18 1635 12/14/18 0145 12/14/18 0342  NA 132*  --    < > 136  --  135  --  137  --  140 140  K 4.4  --    < > 2.9*  --  3.4*  --  3.2*  --  3.7 3.7  CL 93*  --   --  97*  --   --   --  101  --   --  102  CO2 26  --   --  24  --   --   --  22  --   --  23  GLUCOSE 96  --   --  133*  --   --   --  263*  --   --  138*  BUN 25*  --   --  21*  --   --   --  45*  --   --  61*  CREATININE 2.23* 2.33*  --  1.93*  --   --   --  4.50*  --   --  6.86*  CALCIUM 8.4*  --   --  8.2*  --   --   --  7.2*  --   --  7.3*  MG  --   --   --   --  2.2  --  2.2 1.9 2.1  --   --   PHOS  --   --   --   --  3.6  --  5.5* 4.4 6.8*  --   --    < > = values in this interval not displayed.   GFR: Estimated Creatinine Clearance: 18.6 mL/min (A) (by C-G formula based on SCr of 6.86 mg/dL (H)). Recent Labs  Lab 12/11/18 0040 12/11/18 0325 12/11/18 0520 12/12/18 0517 12/13/18 0541 12/14/18 0342  WBC 9.8  --  5.8 8.2 14.2* 10.7*  LATICACIDVEN 1.8 1.0  --   --   --   --     Liver Function Tests: Recent Labs  Lab 12/11/18 0040 12/12/18 0517 12/13/18 0814 12/14/18 0342  AST 111* 83* 80* 96*  ALT 45* 33 30 32  ALKPHOS 62 54 41 45  BILITOT 1.7* 0.8 0.5 0.5  PROT  6.8 6.2* 5.6* 5.5*  ALBUMIN 3.4* 2.9* 2.4* 2.1*   No results for input(s): LIPASE, AMYLASE in the last 168 hours. No results for input(s): AMMONIA in the last 168 hours.  ABG    Component Value Date/Time   PHART 7.131 (LL) 12/14/2018 0145   PCO2ART 67.9 (HH) 12/14/2018 0145    PO2ART 56.0 (L) 12/14/2018 0145   HCO3 22.0 12/14/2018 0145   TCO2 24 12/14/2018 0145   ACIDBASEDEF 8.0 (H) 12/14/2018 0145   O2SAT 71.0 12/14/2018 0145     Coagulation Profile: No results for input(s): INR, PROTIME in the last 168 hours.  Cardiac Enzymes: Recent Labs  Lab 12/11/18 0520  TROPONINI <0.03    HbA1C: Hemoglobin A1C  Date/Time Value Ref Range Status  10/06/2018 04:16 PM 9.8 (A) 4.0 - 5.6 % Final  07/30/2018 04:29 PM 8.2 (A) 4.0 - 5.6 % Final   Hgb A1c MFr Bld  Date/Time Value Ref Range Status  04/09/2017 08:56 AM 12.6 (H) 4.6 - 6.5 % Final    Comment:    Glycemic Control Guidelines for People with Diabetes:Non Diabetic:  <6%Goal of Therapy: <7%Additional Action Suggested:  >8%   10/19/2016 04:02 PM 12.1 (H) <5.7 % Final    Comment:      For someone without known diabetes, a hemoglobin A1c value of 6.5% or greater indicates that they may have diabetes and this should be confirmed with a follow-up test.   For someone with known diabetes, a value <7% indicates that their diabetes is well controlled and a value greater than or equal to 7% indicates suboptimal control. A1c targets should be individualized based on duration of diabetes, age, comorbid conditions, and other considerations.   Currently, no consensus exists for use of hemoglobin A1c for diagnosis of diabetes for children.       CBG: Recent Labs  Lab 12/13/18 1305 12/13/18 1539 12/13/18 1919 12/13/18 2330 12/14/18 0346  GLUCAP 245* 254* 230* 150* 130*    This patient is critically ill with multiple organ system failure; which, requires frequent high complexity decision making, assessment, support, evaluation, and titration of therapies. This was completed through the application of advanced monitoring technologies and extensive interpretation of multiple databases. During this encounter critical care time was devoted to patient care services described in this note for 38 minutes.   Garner Nash, DO [facility] 12/14/2018 7:46 AM  Personal pager: [phone] If unanswered, please page CCM On-call: [phone]

## 2018-12-14 NOTE — Significant Event (Addendum)
PCCM Overnight   Called to see patient at bedside At time in prone position with Sats 81% On PEEP 20 FiO2 100%  Was biting tube Hypotensive started on Neo gtt   Interventions Pt started on continuous Nimbex Attempted recruitment maneuvers with inspiratory hold and pt desaturated Does not tolerate   Plan: ABG ordered CXR ordered Switching Neo to Levophed Continues on ARDS protocol    Signed Dr Seward [facility]

## 2018-12-14 NOTE — Progress Notes (Addendum)
PCCM:  I called [facility] ECMO team and discussed the case with them via conference call.   They have requested cannulation by TCTS here if willing and they will plan to transfer to [facility] later today for vvECMO.  I will consult TCTS.   Garner Nash, DO [facility] 12/14/2018 1:21 PM  Personal pager: [phone] If unanswered, please page CCM On-call: [phone]   Patients oxygenation improving with bed positioning. Right lung down left lung up to improve VQ mismatch. iNO titrated up to 40ppb.   Vent settings reviewed.  Discussed iNO titration Vasopressors titrated Sedation titrated and discussed with nursing   I have spoke with Dr. Nils Pyle. We appreciate TCTS input.  This patient is critically ill with multiple organ system failure; which, requires frequent high complexity decision making, assessment, support, evaluation, and titration of therapies. This was completed through the application of advanced monitoring technologies and extensive interpretation of multiple databases. During this encounter critical care time was devoted to patient care services described in this note for 85 minutes.    Garner Nash, DO [facility] 12/14/2018 2:23 PM  Personal pager: [phone] If unanswered, please page CCM On-call: [phone]

## 2018-12-14 NOTE — Progress Notes (Signed)
Televideo conference with patient and family.

## 2018-12-14 NOTE — Progress Notes (Addendum)
PCCM:  I called and spoke with the patients wife. She understands that he is very ill and has poor prognosis. She understands that he will likely pass from this disease if he continues to decline.   I explained the information gleaned from [facility] team, cardiology and TCTS discussions. Please see separate notes from Drs Prescott Gum and [place].   [place] 12/14/2018 4:36 PM

## 2018-12-14 NOTE — Progress Notes (Signed)
   I have reviewed the chart and have discussed the case with Drs. Prescott Gum, McQuaid and Icard personally. I have also discussed with our ECMO coordinator here as well as Dr. Dorothyann Peng and the [facility] team.   We all agree that the patient's mortality at the point is incredibly high due to COVID-related ARDS, sepsis, A/CKD (current creatinine 6.9), acidosis (last ABG ph 7.1) and morbid obesity. In reviewing his records, baseline creatinine likely in the 1.9-2.3 range with most recent HgBA1c at 9.8.   Given our limited ECMO resources and the patient's comorbidities and overwhelmingly high mortality risk even with ECMO, we do not feel that we can offer ECMO support to this patient at this time.   The [facility] team agrees with this decision and feels he would likely be out of the window for survival if they came here to cannulate the patient personally.   Glori Bickers, MD  3:31 PM

## 2018-12-15 ENCOUNTER — Inpatient Hospital Stay (HOSPITAL_COMMUNITY): Payer: BLUE CROSS/BLUE SHIELD

## 2018-12-15 LAB — POCT I-STAT 7, (LYTES, BLD GAS, ICA,H+H)
Acid-base deficit: 8 mmol/L — ABNORMAL HIGH (ref 0.0–2.0)
Bicarbonate: 19.9 mmol/L — ABNORMAL LOW (ref 20.0–28.0)
Calcium, Ion: 0.98 mmol/L — ABNORMAL LOW (ref 1.15–1.40)
HCT: 40 % (ref 39.0–52.0)
Hemoglobin: 13.6 g/dL (ref 13.0–17.0)
O2 Saturation: 89 %
Patient temperature: 99.4
Potassium: 4.8 mmol/L (ref 3.5–5.1)
Sodium: 137 mmol/L (ref 135–145)
TCO2: 21 mmol/L — ABNORMAL LOW (ref 22–32)
pCO2 arterial: 50.9 mmHg — ABNORMAL HIGH (ref 32.0–48.0)
pH, Arterial: 7.204 — ABNORMAL LOW (ref 7.350–7.450)
pO2, Arterial: 70 mmHg — ABNORMAL LOW (ref 83.0–108.0)

## 2018-12-15 LAB — CBC WITH DIFFERENTIAL/PLATELET
Abs Immature Granulocytes: 0.2 10*3/uL — ABNORMAL HIGH (ref 0.00–0.07)
Basophils Absolute: 0.1 10*3/uL (ref 0.0–0.1)
Basophils Relative: 0 %
Eosinophils Absolute: 0 10*3/uL (ref 0.0–0.5)
Eosinophils Relative: 0 %
HCT: 42.6 % (ref 39.0–52.0)
Hemoglobin: 13.7 g/dL (ref 13.0–17.0)
Immature Granulocytes: 1 %
Lymphocytes Relative: 6 %
Lymphs Abs: 1.1 10*3/uL (ref 0.7–4.0)
MCH: 28.8 pg (ref 26.0–34.0)
MCHC: 32.2 g/dL (ref 30.0–36.0)
MCV: 89.7 fL (ref 80.0–100.0)
Monocytes Absolute: 1 10*3/uL (ref 0.1–1.0)
Monocytes Relative: 5 %
Neutro Abs: 16.5 10*3/uL — ABNORMAL HIGH (ref 1.7–7.7)
Neutrophils Relative %: 88 %
Platelets: 119 10*3/uL — ABNORMAL LOW (ref 150–400)
RBC: 4.75 MIL/uL (ref 4.22–5.81)
RDW: 13 % (ref 11.5–15.5)
WBC Morphology: INCREASED
WBC: 18.9 10*3/uL — ABNORMAL HIGH (ref 4.0–10.5)
nRBC: 0 % (ref 0.0–0.2)

## 2018-12-15 LAB — RENAL FUNCTION PANEL
Albumin: 1.7 g/dL — ABNORMAL LOW (ref 3.5–5.0)
Anion gap: 17 — ABNORMAL HIGH (ref 5–15)
BUN: 84 mg/dL — ABNORMAL HIGH (ref 6–20)
CO2: 19 mmol/L — ABNORMAL LOW (ref 22–32)
Calcium: 6.5 mg/dL — ABNORMAL LOW (ref 8.9–10.3)
Chloride: 103 mmol/L (ref 98–111)
Creatinine, Ser: 9.54 mg/dL — ABNORMAL HIGH (ref 0.61–1.24)
GFR calc Af Amer: 7 mL/min — ABNORMAL LOW (ref >60)
GFR calc non Af Amer: 6 mL/min — ABNORMAL LOW (ref >60)
Glucose, Bld: 312 mg/dL — ABNORMAL HIGH (ref 70–99)
Phosphorus: 10.7 mg/dL — ABNORMAL HIGH (ref 2.5–4.6)
Potassium: 5.6 mmol/L — ABNORMAL HIGH (ref 3.5–5.1)
Sodium: 139 mmol/L (ref 135–145)

## 2018-12-15 LAB — COMPREHENSIVE METABOLIC PANEL
ALT: 40 U/L (ref 0–44)
AST: 100 U/L — ABNORMAL HIGH (ref 15–41)
Albumin: 1.8 g/dL — ABNORMAL LOW (ref 3.5–5.0)
Alkaline Phosphatase: 77 U/L (ref 38–126)
Anion gap: 17 — ABNORMAL HIGH (ref 5–15)
BUN: 76 mg/dL — ABNORMAL HIGH (ref 6–20)
CO2: 20 mmol/L — ABNORMAL LOW (ref 22–32)
Calcium: 6.8 mg/dL — ABNORMAL LOW (ref 8.9–10.3)
Chloride: 102 mmol/L (ref 98–111)
Creatinine, Ser: 8.77 mg/dL — ABNORMAL HIGH (ref 0.61–1.24)
GFR calc Af Amer: 7 mL/min — ABNORMAL LOW (ref >60)
GFR calc non Af Amer: 6 mL/min — ABNORMAL LOW (ref >60)
Glucose, Bld: 311 mg/dL — ABNORMAL HIGH (ref 70–99)
Potassium: 4.8 mmol/L (ref 3.5–5.1)
Sodium: 139 mmol/L (ref 135–145)
Total Bilirubin: 0.6 mg/dL (ref 0.3–1.2)
Total Protein: 5.1 g/dL — ABNORMAL LOW (ref 6.5–8.1)

## 2018-12-15 LAB — GLUCOSE, CAPILLARY
Glucose-Capillary: 188 mg/dL — ABNORMAL HIGH (ref 70–99)
Glucose-Capillary: 255 mg/dL — ABNORMAL HIGH (ref 70–99)
Glucose-Capillary: 269 mg/dL — ABNORMAL HIGH (ref 70–99)
Glucose-Capillary: 270 mg/dL — ABNORMAL HIGH (ref 70–99)
Glucose-Capillary: 273 mg/dL — ABNORMAL HIGH (ref 70–99)
Glucose-Capillary: 289 mg/dL — ABNORMAL HIGH (ref 70–99)

## 2018-12-15 LAB — APTT: aPTT: 29 seconds (ref 24–36)

## 2018-12-15 LAB — POCT ACTIVATED CLOTTING TIME
Activated Clotting Time: 131 seconds
Activated Clotting Time: 136 seconds
Activated Clotting Time: 158 seconds
Activated Clotting Time: 153 seconds
Activated Clotting Time: 158 seconds
Activated Clotting Time: 158 seconds

## 2018-12-15 LAB — QUANTIFERON-TB GOLD PLUS (RQFGPL)
QuantiFERON Mitogen Value: 0.83 IU/mL
QuantiFERON Nil Value: 0.03 IU/mL
QuantiFERON TB1 Ag Value: 0.03 IU/mL
QuantiFERON TB2 Ag Value: 0.03 IU/mL

## 2018-12-15 LAB — PROTIME-INR
INR: 1.2 (ref 0.8–1.2)
Prothrombin Time: 14.6 seconds (ref 11.4–15.2)

## 2018-12-15 LAB — MAGNESIUM: Magnesium: 2.5 mg/dL — ABNORMAL HIGH (ref 1.7–2.4)

## 2018-12-15 LAB — QUANTIFERON-TB GOLD PLUS: QuantiFERON-TB Gold Plus: NEGATIVE

## 2018-12-15 MED ORDER — HEPARIN SODIUM (PORCINE) 1000 UNIT/ML DIALYSIS
1000.0000 [IU] | INTRAMUSCULAR | Status: DC | PRN
  Administered 2018-12-15: 12:00:00 3000 [IU] via INTRAVENOUS_CENTRAL
  Filled 2018-12-15 (×2): qty 6
  Filled 2018-12-15: qty 4

## 2018-12-15 MED ORDER — PRISMASOL BGK 4/2.5 32-4-2.5 MEQ/L REPLACEMENT SOLN
Status: DC
  Administered 2018-12-15 – 2018-12-17 (×5): via INTRAVENOUS_CENTRAL
  Filled 2018-12-15 (×8): qty 5000

## 2018-12-15 MED ORDER — HEPARIN BOLUS VIA INFUSION (CRRT)
1000.0000 [IU] | INTRAVENOUS | Status: DC | PRN
  Administered 2018-12-15 – 2018-12-16 (×3): 1000 [IU] via INTRAVENOUS_CENTRAL
  Filled 2018-12-15 (×2): qty 1000

## 2018-12-15 MED ORDER — FENTANYL CITRATE (PF) 2500 MCG/50ML IJ SOLN
100.0000 ug/h | Status: DC
  Administered 2018-12-15: 10:00:00 175 ug/h via INTRAVENOUS
  Administered 2018-12-16: 06:00:00 200 ug/h via INTRAVENOUS
  Filled 2018-12-15 (×2): qty 100

## 2018-12-15 MED ORDER — SODIUM CHLORIDE 0.9 % IV SOLN
250.0000 [IU]/h | INTRAVENOUS | Status: DC
  Administered 2018-12-15: 250 [IU]/h via INTRAVENOUS_CENTRAL
  Administered 2018-12-16 (×2): 1850 [IU]/h via INTRAVENOUS_CENTRAL
  Administered 2018-12-16 (×2): 1650 [IU]/h via INTRAVENOUS_CENTRAL
  Administered 2018-12-16: 1250 [IU]/h via INTRAVENOUS_CENTRAL
  Administered 2018-12-17: 1950 [IU]/h via INTRAVENOUS_CENTRAL
  Administered 2018-12-17: 06:00:00 1900 [IU]/h via INTRAVENOUS_CENTRAL
  Filled 2018-12-15 (×8): qty 2

## 2018-12-15 MED ORDER — NOREPINEPHRINE 16 MG/250ML-% IV SOLN
0.0000 ug/min | INTRAVENOUS | Status: DC
  Administered 2018-12-15: 10 ug/min via INTRAVENOUS
  Administered 2018-12-16: 14:00:00 40 ug/min via INTRAVENOUS
  Administered 2018-12-16: 22:00:00 60 ug/min via INTRAVENOUS
  Administered 2018-12-16: 20 ug/min via INTRAVENOUS
  Administered 2018-12-16 – 2018-12-17 (×3): 60 ug/min via INTRAVENOUS
  Filled 2018-12-15 (×6): qty 250

## 2018-12-15 MED ORDER — SODIUM CHLORIDE 0.9 % IV SOLN
INTRAVENOUS | Status: DC | PRN
  Administered 2018-12-15: 1000 mL via INTRAVENOUS

## 2018-12-15 MED ORDER — AMIODARONE IV BOLUS ONLY 150 MG/100ML
150.0000 mg | Freq: Once | INTRAVENOUS | Status: AC
  Administered 2018-12-15: 150 mg via INTRAVENOUS

## 2018-12-15 MED ORDER — PRISMASOL BGK 4/2.5 32-4-2.5 MEQ/L IV SOLN
INTRAVENOUS | Status: DC
  Administered 2018-12-15 – 2018-12-17 (×14): via INTRAVENOUS_CENTRAL
  Filled 2018-12-15 (×29): qty 5000

## 2018-12-15 MED ORDER — ALTEPLASE 2 MG IJ SOLR
1.4000 mg | Freq: Once | INTRAMUSCULAR | Status: DC | PRN
  Filled 2018-12-15: qty 2

## 2018-12-15 MED ORDER — SODIUM CHLORIDE 0.9 % IV BOLUS: 500.0000 mL | Freq: Once | INTRAVENOUS | Status: AC

## 2018-12-15 MED ORDER — HEPARIN (PORCINE) 2000 UNITS/L FOR CRRT
INTRAVENOUS_CENTRAL | Status: DC | PRN
  Filled 2018-12-15: qty 1000

## 2018-12-15 MED ORDER — HEPARIN SODIUM (PORCINE) 5000 UNIT/ML IJ SOLN
5000.0000 [IU] | Freq: Three times a day (TID) | INTRAMUSCULAR | Status: DC
  Administered 2018-12-15 – 2018-12-17 (×5): 5000 [IU] via SUBCUTANEOUS
  Filled 2018-12-15 (×6): qty 1

## 2018-12-15 MED ORDER — PRISMASOL BGK 4/2.5 32-4-2.5 MEQ/L REPLACEMENT SOLN
Status: DC
  Administered 2018-12-15 – 2018-12-16 (×2): via INTRAVENOUS_CENTRAL
  Filled 2018-12-15 (×3): qty 5000

## 2018-12-15 MED ORDER — AMIODARONE HCL IN DEXTROSE 360-4.14 MG/200ML-% IV SOLN
INTRAVENOUS | Status: AC
  Administered 2018-12-15: 21:00:00
  Filled 2018-12-15: qty 200

## 2018-12-15 NOTE — Progress Notes (Signed)
NAME:  Dekker Verga., MRN:  [id number], DOB:  [birth year], LOS: 4 ADMISSION DATE:  12/22/2018, CONSULTATION DATE:  4/3 REFERRING MD:  netty, CHIEF COMPLAINT:  Acute hypoxic respiratory failure    Brief History    48 year old male admitted 4/2 with IFI, multifocal pneumonia, intubated on 4/3 for ARDS, COVID-19 POSITIVE   History of present illness    48 year old male admitted 4/1 w/ cc: 3d h/o cough, fever, diarrhea, decreased appetite. Treated w/ out-pt abx, initially better. Then had worsening fever and syncopal event while using commode on 4/1. In ER temp 101. Room air sats 85%. WBC nml, absolute lymph 1.1, RVP neg, influenza PCR neg. CXR w/ bilateral infiltrates. COVID-19 sent. Admitted 4/2 w/ working dx of sepsis, acute hypoxic resp failure (requiring 4 liters) and AKI. Started on CTX and azith. W/ high flow oxygen, and IVFs. PCCM asked to see on 4/3 as pt having increased oxygen requirements titrated up from 4-5 liters to 14 liters high flow.   Past Medical History  Type II DM, HTN  Significant Hospital Events   4/2 admitted. 4 liters [place] 4/3 increased O2 up to 14 liters high flow. Transferred to ICU w/ PCCM consult  4/4: Actemra X 1 4/4: PRONED, iNO started   Consults:  PCCM 4/3  Procedures:  ETT 4/3  Significant Diagnostic Tests:  COVID-19 POSITIVE   Micro Data:  RVP 4/2: neg Influenza 4/2: neg UC 4/1: neg N-Coraona virus 4/1>>> POSITIVE  BCX2 4/1>>>NTDG   Antimicrobials:  azith 4/1>>> Rocephin 4/1>>>  Interim history/subjective:  stablized overnight. sats improved. PaO2 in 70s. Care discussed with renal this AM. Plans for HD cath and CRRT for volume removal   Objective   Blood pressure (!) 94/55, pulse (!) 137, temperature 99.6 F (37.6 C), temperature source Oral, resp. rate (!) 35, height 6\' 1"  (1.854 m), weight 134.6 kg, SpO2 95 %.    Vent Mode: PRVC FiO2 (%):  [70 %-100 %] 70 % Set Rate:  [35 bmp] 35 bmp Vt Set:  [470 mL] 470 mL PEEP:  [18 cmH20-22  cmH20] 18 cmH20 Plateau Pressure:  [32 cmH20-35 cmH20] 32 cmH20   Intake/Output Summary (Last 24 hours) at 12/15/2018 1749 Last data filed at 12/15/2018 0900 Gross per 24 hour  Intake 2467.07 ml  Output 410 ml  Net 2057.07 ml   Filed Weights   12/13/18 0500 12/14/18 0356 12/15/18 0345  Weight: 130.3 kg 130.1 kg 134.6 kg    Examination: General: young male, intubated, sedated, on mv, proned which limits chest exam HENT: NCAT, face padded  PULM: BL vented breath sounds  CV: RRR, s1 s2, no MRG   GI: abdomen proned MSK: wnl, normal bullk and tone  Neuro: sedated, paralyzed   Resolved Hospital Problem list     Assessment & Plan:   Acute Hypoxic respiratory Failure, ARDS secondary to COVID-19  CXR reviewed: only really using RUL, left lung full consolidation  Plan Intubated Sedated Paralyzed Proned  ARDS protocol  Weaning FiO2 as tolerated  Maintaining PEEP  LTVV  PAD sedation guideline   Septic shock 2/2 above   Plan Continue azithromycin Plaquenil  Titrate pressors to maintain MAP>78mmHg   ARF, AKI on CKD presumed baseline  Plan Starting CRRT today if possible mainly for fluid removal  At this point we have to use this window of time frame to get a line in him If we dont remove volume his RV will fail and he will die  His overall  prognosis with advanced ARDs is that he will likely die from this disease We will attempt line placement in right lateral decub position or fully unprone if needed   Fluid and electrolyte imbalance: hypokalemia  Plan Replete k in rrt bath once started   DM w/ hyperglycemia Plan Continue ssi   Family: Discussed with family yesterday and consent obtained for line placement   Best practice  Diet: NPO, assess for tubefeeds Pain/Anxiety/Delirium protocol (if indicated): 4/3 VAP protocol (if indicated): 4/3 DVT prophylaxis: LMWH GI prophylaxis: PPI Glucose control: 4/3 Mobility: BR Code Status: full code  Family Communication:  pending  Disposition: critically ill needs intubation/ARDS protocol   Labs    CBC: Recent Labs  Lab 12/11/18 0040 12/11/18 0520  12/12/18 0517  12/13/18 0541 12/14/18 0145 12/14/18 0342 12/14/18 1632 12/15/18 0222 12/15/18 0334  WBC 9.8 5.8  --  8.2  --  14.2*  --  10.7*  --   --  18.9*  NEUTROABS 7.8*  --   --  6.0  --  11.2*  --  8.6*  --   --  16.5*  HGB 15.8 12.1*   < > 15.2   < > 14.6 15.6 15.6 15.0 13.6 13.7  HCT 47.4 35.5*   < > 45.1   < > 43.2 46.0 50.1 44.0 40.0 42.6  MCV 87.0 89.0  --  86.7  --  89.4  --  92.1  --   --  89.7  PLT 117* 93*  --  109*  --  103*  --  118*  --   --  119*   < > = values in this interval not displayed.    Basic Metabolic Panel: Recent Labs  Lab 12/11/18 0040 12/11/18 0520  12/12/18 0517 12/12/18 1231  12/12/18 1700 12/13/18 0814 12/13/18 1635 12/14/18 0145 12/14/18 0342 12/14/18 1632 12/15/18 0222 12/15/18 0334  NA 132*  --    < > 136  --    < >  --  137  --  140 140 137 137 139  K 4.4  --    < > 2.9*  --    < >  --  3.2*  --  3.7 3.7 4.8 4.8 4.8  CL 93*  --   --  97*  --   --   --  101  --   --  102  --   --  102  CO2 26  --   --  24  --   --   --  22  --   --  23  --   --  20*  GLUCOSE 96  --   --  133*  --   --   --  263*  --   --  138*  --   --  311*  BUN 25*  --   --  21*  --   --   --  45*  --   --  61*  --   --  76*  CREATININE 2.23* 2.33*  --  1.93*  --   --   --  4.50*  --   --  6.86*  --   --  8.77*  CALCIUM 8.4*  --   --  8.2*  --   --   --  7.2*  --   --  7.3*  --   --  6.8*  MG  --   --   --   --  2.2  --  2.2 1.9 2.1  --   --   --   --   --   PHOS  --   --   --   --  3.6  --  5.5* 4.4 6.8*  --   --   --   --   --    < > = values in this interval not displayed.   GFR: Estimated Creatinine Clearance: 14.8 mL/min (A) (by C-G formula based on SCr of 8.77 mg/dL (H)). Recent Labs  Lab 12/11/18 0040 12/11/18 0325  12/12/18 0517 12/13/18 0541 12/14/18 0342 12/15/18 0334  WBC 9.8  --    < > 8.2 14.2* 10.7* 18.9*   LATICACIDVEN 1.8 1.0  --   --   --   --   --    < > = values in this interval not displayed.    Liver Function Tests: Recent Labs  Lab 12/11/18 0040 12/12/18 0517 12/13/18 0814 12/14/18 0342 12/15/18 0334  AST 111* 83* 80* 96* 100*  ALT 45* 33 30 32 40  ALKPHOS 62 54 41 45 77  BILITOT 1.7* 0.8 0.5 0.5 0.6  PROT 6.8 6.2* 5.6* 5.5* 5.1*  ALBUMIN 3.4* 2.9* 2.4* 2.1* 1.8*   No results for input(s): LIPASE, AMYLASE in the last 168 hours. No results for input(s): AMMONIA in the last 168 hours.  ABG    Component Value Date/Time   PHART 7.204 (L) 12/15/2018 0222   PCO2ART 50.9 (H) 12/15/2018 0222   PO2ART 70.0 (L) 12/15/2018 0222   HCO3 19.9 (L) 12/15/2018 0222   TCO2 21 (L) 12/15/2018 0222   ACIDBASEDEF 8.0 (H) 12/15/2018 0222   O2SAT 89.0 12/15/2018 0222     Coagulation Profile: Recent Labs  Lab 12/14/18 1318  INR 1.3*    Cardiac Enzymes: Recent Labs  Lab 12/11/18 0520  TROPONINI <0.03    HbA1C: Hemoglobin A1C  Date/Time Value Ref Range Status  10/06/2018 04:16 PM 9.8 (A) 4.0 - 5.6 % Final  07/30/2018 04:29 PM 8.2 (A) 4.0 - 5.6 % Final   Hgb A1c MFr Bld  Date/Time Value Ref Range Status  04/09/2017 08:56 AM 12.6 (H) 4.6 - 6.5 % Final    Comment:    Glycemic Control Guidelines for People with Diabetes:Non Diabetic:  <6%Goal of Therapy: <7%Additional Action Suggested:  >8%   10/19/2016 04:02 PM 12.1 (H) <5.7 % Final    Comment:      For someone without known diabetes, a hemoglobin A1c value of 6.5% or greater indicates that they may have diabetes and this should be confirmed with a follow-up test.   For someone with known diabetes, a value <7% indicates that their diabetes is well controlled and a value greater than or equal to 7% indicates suboptimal control. A1c targets should be individualized based on duration of diabetes, age, comorbid conditions, and other considerations.   Currently, no consensus exists for use of hemoglobin A1c for diagnosis  of diabetes for children.       CBG: Recent Labs  Lab 12/14/18 1544 12/14/18 1922 12/14/18 2319 12/15/18 0332 12/15/18 0825  GLUCAP 224* 270* 282* 289* 273*    This patient is critically ill with multiple organ system failure; which, requires frequent high complexity decision making, assessment, support, evaluation, and titration of therapies. This was completed through the application of advanced monitoring technologies and extensive interpretation of multiple databases. During this encounter critical care time was devoted to patient care services described in this note for 88 minutes.  Octavio Graves  Valeta Harms DO [facility] 12/15/2018 9:37 AM  Personal pager: [phone] If unanswered, please page CCM On-call: [phone]

## 2018-12-15 NOTE — Progress Notes (Signed)
[facility] Progress Note    Assessment/ Plan:    1. Septic shock d/t COVID-19 and CAP: on azithro, had CTX for CAP.  On multiple pressors.  Please see discussion below.  2. Severe ARDS d/t COVID-19: severely hypoxic- now paralyzed, proned, R lung down,  ECMO team at [facility] contacted- not a candidate.  On plaquenil and given tocilizumab for cytokine release syndrome.  ABGs improving, last PAO2 70.      3.  Acute Kidney Injury on CKD 3: oliguric, failing Lasix challenge, progressive volume overload.  Discussed with PCCM (Dr. Valeta Harms).  Will have been proned for 24 hrs at 11 am this morning.  Do not want to lose window of safely repositioning pt for HD line.  Will assess pt at 11 am.  If benefits outweigh risks, PCCM to attempt line placement with as little repositioning as possible in the L IJ.  Discussed with PCCM.  Greatly appreciate their expertise.    4.  Morbid obesity  5.  Diabetes: per primary  6.  Dispo: remains critically ill in the ICU with a poor prognosis.   Subjective:    Still proned.  ABGs improving.  8L +.  Not making much urine.  Numbers worsening.     Objective:   BP (!) 93/56   Pulse (!) 134   Temp 99.6 F (37.6 C) (Oral)   Resp (!) 35   Ht _0  (1.854 m)   Wt 134.6 kg   SpO2 99%   BMI 39.15 kg/m   Intake/Output Summary (Last 24 hours) at 12/15/2018 0837 Last data filed at 12/15/2018 0800 Gross per 24 hour  Intake 2456.05 ml  Output 355 ml  Net 2101.05 ml   Weight change: 4.5 kg  Physical Exam: [id number] ill man, proned, paralyzed, intubated, sedated. Remainder of exam not performed due to COVID-19 positive status, relying on examination by Dr. Valeta Harms.  Imaging: Dg Chest Port 1 View  Result Date: 12/14/2018 CLINICAL DATA:  ARDS EXAM: PORTABLE CHEST 1 VIEW COMPARISON:  12/14/2018 FINDINGS: Endotracheal tube with the tip 6.4 cm above the carina. Right-sided PICC line with the tip in unchanged position. Patchy areas of right upper lobe and  right lower lobe airspace disease similar to the prior exam. Extensive areas of airspace disease in the left upper lobe and left lower lobe unchanged in the prior exam. No pneumothorax. No significant pleural effusion. Stable cardiomediastinal silhouette. No aggressive osseous lesion. IMPRESSION: 1. Bilateral persistent airspace disease most severe in the left upper and left lower lobes. Electronically Signed   By: Kathreen Devoid   On: 12/14/2018 13:58   Dg Chest Port 1 View  Result Date: 12/14/2018 CLINICAL DATA:  Worsening respiratory failure. EXAM: PORTABLE CHEST 1 VIEW COMPARISON:  December 12, 2018 FINDINGS: The ETT is in good position. The right PICC line terminates centrally, likely in the central SVC. No pneumothorax. Significantly worsened infiltrate in the left mid lower lung. Patchy infiltrate on the right is mildly worsened. No other interval changes. IMPRESSION: 1. Support apparatus as above. 2. Worsening bilateral pulmonary infiltrates, particularly on the left. Electronically Signed   By: Dorise Bullion III M.D   On: 12/14/2018 01:30    Labs: BMET Recent Labs  Lab 12/11/18 0040 12/11/18 1025  12/12/18 8527 12/12/18 1231 12/12/18 1516 12/12/18 1700 12/13/18 7824 12/13/18 1635 12/14/18 0145 12/14/18 0342 12/14/18 1632 12/15/18 0222 12/15/18 0334  NA 132*  --    < > 136  --  135  --  137  --  140 140 137 137 139  K 4.4  --    < > 2.9*  --  3.4*  --  3.2*  --  3.7 3.7 4.8 4.8 4.8  CL 93*  --   --  97*  --   --   --  101  --   --  102  --   --  102  CO2 26  --   --  24  --   --   --  22  --   --  23  --   --  20*  GLUCOSE 96  --   --  133*  --   --   --  263*  --   --  138*  --   --  311*  BUN 25*  --   --  21*  --   --   --  45*  --   --  61*  --   --  76*  CREATININE 2.23* 2.33*  --  1.93*  --   --   --  4.50*  --   --  6.86*  --   --  8.77*  CALCIUM 8.4*  --   --  8.2*  --   --   --  7.2*  --   --  7.3*  --   --  6.8*  PHOS  --   --   --   --  3.6  --  5.5* 4.4 6.8*  --   --    --   --   --    < > = values in this interval not displayed.   CBC Recent Labs  Lab 12/12/18 0517  12/13/18 0541  12/14/18 0342 12/14/18 1632 12/15/18 0222 12/15/18 0334  WBC 8.2  --  14.2*  --  10.7*  --   --  18.9*  NEUTROABS 6.0  --  11.2*  --  8.6*  --   --  16.5*  HGB 15.2   < > 14.6   < > 15.6 15.0 13.6 13.7  HCT 45.1   < > 43.2   < > 50.1 44.0 40.0 42.6  MCV 86.7  --  89.4  --  92.1  --   --  89.7  PLT 109*  --  103*  --  118*  --   --  119*   < > = values in this interval not displayed.    Medications:    . artificial tears  1 application Both Eyes D9R  . aspirin  150 mg Rectal Daily  . chlorhexidine gluconate (MEDLINE KIT)  15 mL Mouth Rinse BID  . Chlorhexidine Gluconate Cloth  6 each Topical Daily  . enoxaparin (LOVENOX) injection  65 mg Subcutaneous Q24H  . feeding supplement (PRO-STAT SUGAR FREE 64)  30 mL Per Tube BID  . hydroxychloroquine  200 mg Oral BID  . insulin aspart  0-20 Units Subcutaneous Q4H  . mouth rinse  15 mL Mouth Rinse 10 times per day  . pantoprazole (PROTONIX) IV  40 mg Intravenous Q24H  . sodium chloride flush  10-40 mL Intracatheter Q12H  . zinc sulfate  220 mg Per Tube Daily      Madelon Lips, MD [facility] pgr [phone] 12/15/2018, 8:37 AM

## 2018-12-15 NOTE — Progress Notes (Signed)
PCCM:  First LIJ cath was just too short and the red cap port would not draw blood. Line was pulled and a longer 20cm was placed in the right IJ at 19cm. CVVH is now being started. The patient tolerated un-proning and placed supine.   Plan:  Please leave supine for ~8 hours.  Should make plans to re-prone in the AM Would consider waiting until fully staffed in AM before re-proning with physician/app team available  Garner Nash, [facility] 12/15/2018 5:48 PM

## 2018-12-15 NOTE — Procedures (Signed)
Hemodialysis Catheter Insertion Procedure Note Charles Yoder [id number] 09-Dec-1970  Procedure: Insertion of Hemodialysis Catheter Indications: Dialysis Access   Procedure Details Consent: Risks of procedure as well as the alternatives and risks of each were explained to the (patient/caregiver).  Consent for procedure obtained. Time Out: Verified patient identification, verified procedure, site/side was marked, verified correct patient position, special equipment/implants available, medications/allergies/relevent history reviewed, required imaging and test results available.  Performed  Maximum sterile technique was used including antiseptics, cap, gloves, gown, hand hygiene, mask and sheet. Skin prep: Chlorhexidine; local anesthetic administered Triple lumen hemodialysis catheter was inserted into left internal jugular vein using the Seldinger technique.  Evaluation Blood flow good Complications: No apparent complications Patient did tolerate procedure well. Chest X-ray ordered to verify placement.  CXR: pending.   Charles Yoder Charles Yoder 12/15/2018

## 2018-12-15 NOTE — Progress Notes (Signed)
Inpatient Diabetes Program Recommendations  AACE/ADA: New Consensus Statement on Inpatient Glycemic Control (2015)  Target Ranges:  Prepandial:   less than 140 mg/dL      Peak postprandial:   less than 180 mg/dL (1-2 hours)      Critically ill patients:  140 - 180 mg/dL   Lab Results  Component Value Date   GLUCAP 273 (H) 12/15/2018   HGBA1C 9.8 (A) 10/06/2018    Review of Glycemic ControlResults for Charles Yoder, Charles Yoder (MRN [id number]) as of 12/15/2018 10:50  Ref. Range 12/14/2018 23:19 12/15/2018 03:32 12/15/2018 08:25  Glucose-Capillary Latest Ref Range: 70 - 99 mg/dL 282 (H) 289 (H) 273 (H)    Diabetes history: DM 2 Outpatient Diabetes medications: Lantus 100 units q AM Current orders for Inpatient glycemic control:  Novolog resistant q 4 hours Inpatient Diabetes Program Recommendations:     If appropriate, consider adding Levemir 8 units bid.   Thanks,  Adah Perl, RN, BC-ADM Inpatient Diabetes Coordinator Pager [phone] (8a-5p)

## 2018-12-15 NOTE — Progress Notes (Signed)
MD was informed that air was pulling from the red access port on the triple lumen hemodialysis catheter prior to initiating CRRT. MD stated that the line was in place and instructed to flip lines and proceed with CRRT. Bright red blood returned from the access line, L internal jugular catheter removed. Plan to re-insert another on the R side. Patient is stable at this time and will continue to monitor.  Careen Mauch A Hannahgrace Lalli 12/15/18

## 2018-12-15 NOTE — Significant Event (Signed)
Called by RN to assess patient at bedside    Bedside Evaluation: HR in 160s to 180s irregularly irregular O2 sats dropped from 90s to 88 BP 87/62  Pt continues on CRRT with no net removal Was receiving foley care when he decompensated. Remains sedated and paralyzed in supine position   Chem profile from 5pm K= 5.6 AG 17 metabolic acidosis  Phos 10.2  Corrected calcium 8.3 - given pt's hypoalbuminemia  Assessment: Afib RVR May be secondary to hypoxia Pt most likely hypotensive from Atrial fibrilation  Plan: Bolus of 500cc IVF Amiodarone bolus 150 Will check Magnesium  Place pt with good lung down- he does not tolerate being totally flat  O2 sats improved to >94%  HR in 130s -140s still irregular  Will continue to follow closely   Signed Dr Seward Carol Pul[facility] Critical Care Locums

## 2018-12-15 NOTE — Procedures (Signed)
Hemodialysis Insertion Procedure Note Charles Yoder  [id number] [birth year]  Procedure: Insertion of Hemodialysis Catheter Type: 3 port  Indications: Hemodialysis   Procedure Details Consent: Risks of procedure as well as the alternatives and risks of each were explained to the (patient/caregiver).  Consent for procedure obtained. Time Out: Verified patient identification, verified procedure, site/side was marked, verified correct patient position, special equipment/implants available, medications/allergies/relevent history reviewed, required imaging and test results available.  Performed  Maximum sterile technique was used including antiseptics, cap, gloves, gown, hand hygiene, mask and sheet. Skin prep: Chlorhexidine; local anesthetic administered A antimicrobial bonded/coated triple lumen catheter was placed in the right internal jugular vein using the Seldinger technique. Ultrasound guidance used.Yes.   Catheter placed to 18 cm. Blood aspirated via all 3 ports and then flushed x 3. Line sutured x 2 and dressing applied.  Evaluation Blood flow good Complications: No apparent complications Patient did tolerate procedure well. Chest X-ray ordered to verify placement.  CXR: pending.  Georgann Housekeeper, AGACNP-BC [place] Pager [phone] or [phone]  12/15/2018 5:07 PM

## 2018-12-15 NOTE — Progress Notes (Signed)
RT NOTE:  Pt was placed in the supine position without incident.

## 2018-12-15 NOTE — Progress Notes (Signed)
E- visit with family successfu

## 2018-12-16 DIAGNOSIS — R6521 Severe sepsis with septic shock: Secondary | ICD-10-CM

## 2018-12-16 LAB — CULTURE, BLOOD (ROUTINE X 2)
Culture: NO GROWTH
Culture: NO GROWTH
Special Requests: ADEQUATE
Special Requests: ADEQUATE

## 2018-12-16 LAB — POCT ACTIVATED CLOTTING TIME
Activated Clotting Time: 158 seconds
Activated Clotting Time: 164 seconds
Activated Clotting Time: 169 seconds
Activated Clotting Time: 169 seconds
Activated Clotting Time: 180 seconds
Activated Clotting Time: 219 seconds
Activated Clotting Time: 125 seconds
Activated Clotting Time: 180 seconds
Activated Clotting Time: 180 seconds
Activated Clotting Time: 186 seconds

## 2018-12-16 LAB — APTT: aPTT: 54 seconds — ABNORMAL HIGH (ref 24–36)

## 2018-12-16 LAB — RENAL FUNCTION PANEL
Albumin: 1.8 g/dL — ABNORMAL LOW (ref 3.5–5.0)
Albumin: 1.9 g/dL — ABNORMAL LOW (ref 3.5–5.0)
Anion gap: 15 (ref 5–15)
Anion gap: 14 (ref 5–15)
BUN: 57 mg/dL — ABNORMAL HIGH (ref 6–20)
BUN: 35 mg/dL — ABNORMAL HIGH (ref 6–20)
CO2: 21 mmol/L — ABNORMAL LOW (ref 22–32)
CO2: 22 mmol/L (ref 22–32)
Calcium: 7.5 mg/dL — ABNORMAL LOW (ref 8.9–10.3)
Calcium: 7.9 mg/dL — ABNORMAL LOW (ref 8.9–10.3)
Chloride: 104 mmol/L (ref 98–111)
Chloride: 103 mmol/L (ref 98–111)
Creatinine, Ser: 6.7 mg/dL — ABNORMAL HIGH (ref 0.61–1.24)
Creatinine, Ser: 4.2 mg/dL — ABNORMAL HIGH (ref 0.61–1.24)
GFR calc Af Amer: 10 mL/min — ABNORMAL LOW (ref >60)
GFR calc Af Amer: 18 mL/min — ABNORMAL LOW (ref >60)
GFR calc non Af Amer: 9 mL/min — ABNORMAL LOW (ref >60)
GFR calc non Af Amer: 16 mL/min — ABNORMAL LOW (ref >60)
Glucose, Bld: 202 mg/dL — ABNORMAL HIGH (ref 70–99)
Glucose, Bld: 140 mg/dL — ABNORMAL HIGH (ref 70–99)
Phosphorus: 7.9 mg/dL — ABNORMAL HIGH (ref 2.5–4.6)
Phosphorus: 5.1 mg/dL — ABNORMAL HIGH (ref 2.5–4.6)
Potassium: 5.1 mmol/L (ref 3.5–5.1)
Potassium: 4.9 mmol/L (ref 3.5–5.1)
Sodium: 140 mmol/L (ref 135–145)
Sodium: 139 mmol/L (ref 135–145)

## 2018-12-16 LAB — GLUCOSE, CAPILLARY
Glucose-Capillary: 194 mg/dL — ABNORMAL HIGH (ref 70–99)
Glucose-Capillary: 237 mg/dL — ABNORMAL HIGH (ref 70–99)
Glucose-Capillary: 208 mg/dL — ABNORMAL HIGH (ref 70–99)
Glucose-Capillary: 136 mg/dL — ABNORMAL HIGH (ref 70–99)
Glucose-Capillary: 160 mg/dL — ABNORMAL HIGH (ref 70–99)
Glucose-Capillary: 133 mg/dL — ABNORMAL HIGH (ref 70–99)

## 2018-12-16 LAB — CK: Total CK: 6246 U/L — ABNORMAL HIGH (ref 49–397)

## 2018-12-16 LAB — C-REACTIVE PROTEIN: CRP: 22.8 mg/dL — ABNORMAL HIGH (ref <1.0)

## 2018-12-16 LAB — MAGNESIUM: Magnesium: 2.4 mg/dL (ref 1.7–2.4)

## 2018-12-16 MED ORDER — ASPIRIN 81 MG PO CHEW
81.0000 mg | CHEWABLE_TABLET | Freq: Every day | ORAL | Status: DC
  Administered 2018-12-16: 81 mg
  Filled 2018-12-16: qty 1

## 2018-12-16 MED ORDER — INSULIN GLARGINE 100 UNIT/ML ~~LOC~~ SOLN
40.0000 [IU] | Freq: Every day | SUBCUTANEOUS | Status: DC
  Administered 2018-12-16: 40 [IU] via SUBCUTANEOUS
  Filled 2018-12-16 (×3): qty 0.4

## 2018-12-16 MED ORDER — AMIODARONE HCL IN DEXTROSE 360-4.14 MG/200ML-% IV SOLN
30.0000 mg/h | INTRAVENOUS | Status: DC
  Filled 2018-12-16 (×2): qty 200

## 2018-12-16 MED ORDER — AMIODARONE LOAD VIA INFUSION
150.0000 mg | Freq: Once | INTRAVENOUS | Status: DC
  Filled 2018-12-16: qty 83.34

## 2018-12-16 MED ORDER — ACETAMINOPHEN 325 MG PO TABS: 650.0000 mg | ORAL_TABLET | Freq: Four times a day (QID) | ORAL | Status: DC | PRN

## 2018-12-16 MED ORDER — SODIUM CHLORIDE 0.9 % IV SOLN: INTRAVENOUS | Status: DC | PRN

## 2018-12-16 MED ORDER — AMIODARONE HCL IN DEXTROSE 360-4.14 MG/200ML-% IV SOLN
60.0000 mg/h | INTRAVENOUS | Status: AC
  Administered 2018-12-16 (×2): 60 mg/h via INTRAVENOUS

## 2018-12-16 MED ORDER — AMIODARONE IV BOLUS ONLY 150 MG/100ML: 150.0000 mg | Freq: Once | INTRAVENOUS | Status: DC

## 2018-12-16 MED ORDER — TOCILIZUMAB 400 MG/20ML IV SOLN
400.0000 mg | Freq: Once | INTRAVENOUS | Status: AC
  Administered 2018-12-16: 400 mg via INTRAVENOUS
  Filled 2018-12-16 (×2): qty 20

## 2018-12-16 MED ORDER — PRO-STAT SUGAR FREE PO LIQD: 90.0000 mL | Freq: Every day | ORAL | Status: DC

## 2018-12-16 MED ORDER — AMIODARONE HCL IN DEXTROSE 360-4.14 MG/200ML-% IV SOLN
30.0000 mg/h | INTRAVENOUS | Status: DC
  Filled 2018-12-16: qty 200

## 2018-12-16 MED ORDER — AMIODARONE LOAD VIA INFUSION
150.0000 mg | Freq: Once | INTRAVENOUS | Status: AC
  Administered 2018-12-16: 10:00:00 150 mg via INTRAVENOUS
  Filled 2018-12-16: qty 83.34

## 2018-12-16 MED ORDER — VITAL HIGH PROTEIN PO LIQD: 1000.0000 mL | ORAL | Status: DC

## 2018-12-16 MED ORDER — ACETAMINOPHEN 650 MG RE SUPP: 650.0000 mg | Freq: Four times a day (QID) | RECTAL | Status: DC | PRN

## 2018-12-16 MED ORDER — VITAL HIGH PROTEIN PO LIQD
1000.0000 mL | ORAL | Status: DC
  Administered 2018-12-16: 14:00:00 1000 mL

## 2018-12-16 MED ORDER — SODIUM CHLORIDE 0.9 % IV SOLN
1.2500 ng/kg/min | INTRAVENOUS | Status: DC
  Administered 2018-12-16: 23:00:00 40 ng/kg/min via INTRAVENOUS
  Administered 2018-12-16: 15:00:00 5 ng/kg/min via INTRAVENOUS
  Administered 2018-12-17: 08:00:00 40 ng/kg/min via INTRAVENOUS
  Filled 2018-12-16 (×3): qty 1

## 2018-12-16 MED ORDER — PRO-STAT SUGAR FREE PO LIQD
30.0000 mL | Freq: Two times a day (BID) | ORAL | Status: DC
  Administered 2018-12-16: 30 mL

## 2018-12-16 MED ORDER — HYDROCORTISONE NA SUCCINATE PF 100 MG IJ SOLR
50.0000 mg | Freq: Four times a day (QID) | INTRAMUSCULAR | Status: DC
  Administered 2018-12-16 – 2018-12-17 (×4): 50 mg via INTRAVENOUS
  Filled 2018-12-16 (×4): qty 2

## 2018-12-16 MED ORDER — AMIODARONE HCL IN DEXTROSE 360-4.14 MG/200ML-% IV SOLN
60.0000 mg/h | INTRAVENOUS | Status: DC
  Administered 2018-12-16: 04:00:00 60 mg/h via INTRAVENOUS
  Filled 2018-12-16: qty 200

## 2018-12-16 NOTE — Progress Notes (Addendum)
Charles Yoder  [facility] Progress Note    Assessment/ Plan:    1. Septic shock d/t COVID-19 and CAP: on azithro, had CTX for CAP.  On levophed and vaso.  Please see discussion below.  2. Severe ARDS d/t COVID-19: severely hypoxic- now paralyzed, was proned yesterday, now supinated, R lung down, INO, ECMO team at [facility] contacted- not a candidate.  On plaquenil and given tocilizumab for cytokine release syndrome.      3.  Acute Kidney Injury on CKD 3: oliguric, failed Lasix challenge, progressive volume overload. CRRT initated yesterday.    4.  Atrial fibrillation: s/p amiodarone bolus and now load. New onset this admission, likely hypoxia-induced    5.  Morbid obesity  6.  Diabetes: per primary  7.  Dispo: remains critically ill in the ICU with a poor prognosis.   Subjective:    HD cath attempted with pt on side, not deep enough.  Supinated and R IJ nontunneled HD cath placed.  Initially tolerated CRRT well.  Not able to achieve vol removal due to worsening hemodynamics- Afib with RVR last night, hypotensive, got amio and was placed RL down. FIO2 100%, PEEP 18.     Objective:   BP (!) 86/53   Pulse (!) 142   Temp 97.9 F (36.6 C) (Oral)   Resp (!) 35   Ht '6\' 1"'$  (1.854 m)   Wt (!) 137.1 kg   SpO2 (!) 88%   BMI 39.88 kg/m   Intake/Output Summary (Last 24 hours) at 12/16/2018 5053 Last data filed at 12/16/2018 0900 Gross per 24 hour  Intake 2433.57 ml  Output 1113 ml  Net 1320.57 ml   Weight change: 2.5 kg  Physical Exam: [id number] ill man, proned, paralyzed, intubated, sedated. Remainder of exam not performed due to COVID-19 positive status, relying on examination by PCCM team  Imaging: Dg Chest Port 1 View  Result Date: 12/15/2018 CLINICAL DATA:  Hemodialysis catheter placement.  Viral pneumonia. EXAM: PORTABLE CHEST 1 VIEW COMPARISON:  Earlier same day FINDINGS: New right internal jugular central line tip in the SVC 4 cm above the right atrium. Endotracheal  tube tip 6 cm above the carina. Orogastric tube enters the abdomen. Widespread pulmonary infiltrates persist, left more than right. IMPRESSION: New right internal jugular central line tip in the SVC 4 cm above the right atrium. No pneumothorax. Electronically Signed   By: Nelson Chimes M.D.   On: 12/15/2018 17:54   Dg Chest Port 1 View  Result Date: 12/15/2018 CLINICAL DATA:  Bilateral airspace disease. Dialysis catheter dysfunction. EXAM: PORTABLE CHEST 1 VIEW COMPARISON:  Single-view of the chest 12/14/2018. FINDINGS: New left IJ approach central venous catheter is in place. Tip projects just within the left brachiocephalic vein. ETT and right PICC are unchanged. Diffuse bilateral airspace disease persists. No pneumothorax. Cardiac silhouette is obscured. IMPRESSION: Left IJ catheter tip is just within the left brachiocephalic vein. Negative for pneumothorax. No marked change in diffuse bilateral airspace disease. Electronically Signed   By: Inge Rise M.D.   On: 12/15/2018 12:20   Dg Chest Port 1 View  Result Date: 12/14/2018 CLINICAL DATA:  ARDS EXAM: PORTABLE CHEST 1 VIEW COMPARISON:  12/14/2018 FINDINGS: Endotracheal tube with the tip 6.4 cm above the carina. Right-sided PICC line with the tip in unchanged position. Patchy areas of right upper lobe and right lower lobe airspace disease similar to the prior exam. Extensive areas of airspace disease in the left upper lobe and left lower lobe unchanged in  the prior exam. No pneumothorax. No significant pleural effusion. Stable cardiomediastinal silhouette. No aggressive osseous lesion. IMPRESSION: 1. Bilateral persistent airspace disease most severe in the left upper and left lower lobes. Electronically Signed   By: Kathreen Devoid   On: 12/14/2018 13:58    Labs: BMET Recent Labs  Lab 12/11/18 0040 12/11/18 0520  12/12/18 6644 12/12/18 1231  12/12/18 1700 12/13/18 0347 12/13/18 1635 12/14/18 0145 12/14/18 0342 12/14/18 1632 12/15/18 0222  12/15/18 0334 12/15/18 1714 12/16/18 0413  NA 132*  --    < > 136  --    < >  --  137  --  140 140 137 137 139 139 140  K 4.4  --    < > 2.9*  --    < >  --  3.2*  --  3.7 3.7 4.8 4.8 4.8 5.6* 5.1  CL 93*  --   --  97*  --   --   --  101  --   --  102  --   --  102 103 104  CO2 26  --   --  24  --   --   --  22  --   --  23  --   --  20* 19* 21*  GLUCOSE 96  --   --  133*  --   --   --  263*  --   --  138*  --   --  311* 312* 202*  BUN 25*  --   --  21*  --   --   --  45*  --   --  61*  --   --  76* 84* 57*  CREATININE 2.23* 2.33*  --  1.93*  --   --   --  4.50*  --   --  6.86*  --   --  8.77* 9.54* 6.70*  CALCIUM 8.4*  --   --  8.2*  --   --   --  7.2*  --   --  7.3*  --   --  6.8* 6.5* 7.5*  PHOS  --   --   --   --  3.6  --  5.5* 4.4 6.8*  --   --   --   --   --  10.7* 7.9*   < > = values in this interval not displayed.   CBC Recent Labs  Lab 12/12/18 0517  12/13/18 0541  12/14/18 0342 12/14/18 1632 12/15/18 0222 12/15/18 0334  WBC 8.2  --  14.2*  --  10.7*  --   --  18.9*  NEUTROABS 6.0  --  11.2*  --  8.6*  --   --  16.5*  HGB 15.2   < > 14.6   < > 15.6 15.0 13.6 13.7  HCT 45.1   < > 43.2   < > 50.1 44.0 40.0 42.6  MCV 86.7  --  89.4  --  92.1  --   --  89.7  PLT 109*  --  103*  --  118*  --   --  119*   < > = values in this interval not displayed.    Medications:    . amiodarone  150 mg Intravenous Once  . artificial tears  1 application Both Eyes Q2V  . aspirin  81 mg Per Tube Daily  . chlorhexidine gluconate (MEDLINE KIT)  15 mL Mouth Rinse BID  . Chlorhexidine Gluconate Cloth  6  each Topical Daily  . feeding supplement (PRO-STAT SUGAR FREE 64)  30 mL Per Tube BID  . heparin injection (subcutaneous)  5,000 Units Subcutaneous Q8H  . hydroxychloroquine  200 mg Oral BID  . insulin aspart  0-20 Units Subcutaneous Q4H  . mouth rinse  15 mL Mouth Rinse 10 times per day  . pantoprazole (PROTONIX) IV  40 mg Intravenous Q24H  . sodium chloride flush  10-40 mL Intracatheter  Q12H  . zinc sulfate  220 mg Per Tube Daily      Madelon Lips, MD [facility] pgr [phone] 12/16/2018, 9:09 AM

## 2018-12-16 NOTE — Progress Notes (Addendum)
PCCM NOTE   PT PROFILE: 48 y.o. male admitted 04/02 with severe COVID-19, cytokine release syndrome, ARDS, refractory shock, anuric renal failure  EVENTS/RESULTS/DATA: 04/01 admitted to hospitalist service 04/03 worsening hypoxemia.  PCCM consultation.  Required intubation 04/04 nephrology consultation. Received Actemra (Tocilizumab) without discernible benefit 04/05 worsening hypoxemia requiring prone positioning, NMB.  Trial of iNO without discernible benefit and therefore discontinued 04/06 CRRT initiated. AFRVR > amiodarone initiated 04/07 continues to require very high level of ventilatory support.  100% FiO2.  On maximum dose norepinephrine plus vasopressin.  Hydrocortisone initiated.  Angiotensin II infusion initiated. Repeat trial of tocilizumab. Persistent AFRVR > repeat bolus of amiodarone. Severe hyperglycemia > Lantus iniitated   INDWELLING DEVICES: 04/03 ETT >>  04/04 RUE PICC >>  04/06 R IJ HD cath >>   MICRO DATA: MRSA PCR04/02 >> NEG RVP 04/02 >> NEG Urine 04/02 >> NEG Covid-19 PCR 04/02 >> POSITIVE Resp  04/03 >> NEG Blood 04/05 >> NEG  ANTIMICROBIALS:  Ceftriaxone 04/02 >> 04/04 Azithromycin 04/02 >> 04/06 Hydroxychloroquine 04/03 >>   SUBJ: Intubated, sedated, paralyzed  Vitals:   12/16/18 1300 12/16/18 1426 12/16/18 1445 12/16/18 1500  BP: (!) 86/55     Pulse:  (!) 116  (!) 117  Resp: (!) 35 (!) 35 (!) 35 (!) 35  Temp:      TempSrc:      SpO2:  93%  92%  Weight:      Height:       Vent Mode: PRVC FiO2 (%):  [100 %] 100 % Set Rate:  [35 bmp] 35 bmp Vt Set:  [470 mL] 470 mL PEEP:  [18 cmH20] 18 cmH20 Plateau Pressure:  [31 cmH20-32 cmH20] 32 cmH20   EXAM:  Gen: intubated, sedated, paralyzed HEENT: NCAT, sclera white, ETT in place Neck: Supple without LAN, thyromegaly, JVD Lungs: Coarse breath sounds, no wheezes Cardiovascular: RRR, no murmurs noted Abdomen: Soft, nontender, normal BS Ext: Warm, symmetric pedal edema Neuro: Cannot be  assessed due to NMB  Skin: Limited exam, no lesions noted  DATA:   BMP Latest Ref Rng & Units 12/16/2018 12/15/2018 12/15/2018  Glucose 70 - 99 mg/dL 202(H) 312(H) 311(H)  BUN 6 - 20 mg/dL 57(H) 84(H) 76(H)  Creatinine 0.61 - 1.24 mg/dL 6.70(H) 9.54(H) 8.77(H)  Sodium 135 - 145 mmol/L 140 139 139  Potassium 3.5 - 5.1 mmol/L 5.1 5.6(H) 4.8  Chloride 98 - 111 mmol/L 104 103 102  CO2 22 - 32 mmol/L 21(L) 19(L) 20(L)  Calcium 8.9 - 10.3 mg/dL 7.5(L) 6.5(L) 6.8(L)    CBC Latest Ref Rng & Units 12/15/2018 12/15/2018 12/14/2018  WBC 4.0 - 10.5 K/uL 18.9(H) - -  Hemoglobin 13.0 - 17.0 g/dL 13.7 13.6 15.0  Hematocrit 39.0 - 52.0 % 42.6 40.0 44.0  Platelets 150 - 400 K/uL 119(L) - -    CXR:  L>R pulmonary infiltrates/ARDS pattern  I have personally reviewed all chest radiographs reported above including CXRs and CT chest unless otherwise indicated  IMPRESSION:   COVID-19 Severe ARDS with refractory hypoxemia Ventilator dependent respiratory failure Refractory septic shock New onset AF with RVR Anuric AKI Type 2 diabetes with hyperglycemia Mild thrombocytopenia  PLAN:  Cont full vent support - settings reviewed and/or adjusted  ARDS/lung protection strategy Cont vent bundle Daily SBT if/when meets criteria  Hemodynamic monitoring. Continue vasopressors to maintain MAP >60 mmHg  Norepinephrine, vasopressin, angiotensin II, hydrocortisone Continue amiodarone infusion CRRT per nephrology Add Lantus 4/7 Continue SSI, resistant scale DVT px: SCDs Monitor CBC intermittently Transfuse  per usual guidelines   His wife was updated in detail over the phone by me  CCM time: 60 mins The above time includes time spent in consultation with patient and/or family members and reviewing care plan on multidisciplinary rounds  Merton Border, MD PCCM service Mobile [phone] Pager [phone] 12/16/2018 3:10 PM

## 2018-12-16 NOTE — Procedures (Signed)
Arterial Catheter Insertion Procedure Note Missael Ferrari [id number] Jul 26, 1971  Procedure: Insertion of Arterial Catheter  Indications: Blood pressure monitoring and Frequent blood sampling  Procedure Details Consent: Risks of procedure as well as the alternatives and risks of each were explained to the (patient/caregiver).  Consent for procedure obtained. Time Out: Verified patient identification, verified procedure, site/side was marked, verified correct patient position, special equipment/implants available, medications/allergies/relevent history reviewed, required imaging and test results available.  Performed  Maximum sterile technique was used including antiseptics, cap, gloves, gown, hand hygiene, mask and sheet. Skin prep: Chlorhexidine; local anesthetic administered 20 gauge catheter was inserted into right radial artery using the Seldinger technique. ULTRASOUND GUIDANCE USED: NO Evaluation Blood flow good; BP tracing good. Complications: No apparent complications.   Phillis Knack [facility] 12/16/2018

## 2018-12-16 NOTE — Progress Notes (Addendum)
Nutrition Follow-up / Consult  DOCUMENTATION CODES:   Obesity unspecified  INTERVENTION:   Resume TF via OGT:  Vital High Protein at 10 ml/h for now per MD, recommend increase as tolerated by 10 ml every 4 hours to goal rate of 60 ml/h (1440 ml per day)  Pro-stat 90 ml once daily  Provides 1740 kcal, 171 gm protein, 1204 ml free water daily  NUTRITION DIAGNOSIS:   Inadequate oral intake related to inability to eat as evidenced by NPO status.  ongoing  GOAL:   Provide needs based on ASPEN/SCCM guidelines  Being addressed with TF  MONITOR:   Vent status, TF tolerance, Labs, I & O's  REASON FOR ASSESSMENT:   Consult Enteral/tube feeding initiation and management  ASSESSMENT:   48 yo male with PMH of DM-2, HTN, HLD who was admitted with CAP, severe sepsis, and COVID-19 rule out. Required intubation and transfer to the ICU on 4/3 for worsening respiratory failure.  COVID-19 positive. Patient being treated for severe ARDS d/t COVID-19. Not an ECMO candidate. TF on hold for proning yesterday. Received MD Consult to resume TF today. OGT in place.  CRRT initiated 4/6.  Patient remains intubated on ventilator support MV: 16.5 L/min Temp (24hrs), Avg:97.1 F (36.2 C), Min:95 F (35 C), Max:99.2 F (37.3 C)   Labs reviewed. BUN 57 (H), creatinine 6.7 (H) CBG's: 194-237 Medications reviewed and include Solu-cortef, Novolog, Lantus, Nimbex, Versed, Levophed, vasopressin.   I/O +9.6 L since admission  NUTRITION - FOCUSED PHYSICAL EXAM:  unable to complete  Diet Order:   Diet Order    None      EDUCATION NEEDS:   No education needs have been identified at this time  Skin:  Skin Assessment: Reviewed RN Assessment  Last BM:  4/3 type 7  Height:   Ht Readings from Last 1 Encounters:  12/11/18 6\' 1"  (1.854 m)    Weight:   Wt Readings from Last 1 Encounters:  12/16/18 (!) 137.1 kg    Ideal Body Weight:  83.6 kg  BMI:  Body mass index is 39.88  kg/m.  Estimated Nutritional Needs:   Kcal:  1400-1800  Protein:  167-209 gm  Fluid:  2 L    Molli Barrows, RD, LDN, [place] Pager [phone] After Hours Pager [phone]

## 2018-12-17 ENCOUNTER — Inpatient Hospital Stay (HOSPITAL_COMMUNITY): Payer: BLUE CROSS/BLUE SHIELD

## 2018-12-17 LAB — CBC
HCT: 51.2 % (ref 39.0–52.0)
Hemoglobin: 15.7 g/dL (ref 13.0–17.0)
MCH: 29.2 pg (ref 26.0–34.0)
MCHC: 30.7 g/dL (ref 30.0–36.0)
MCV: 95.2 fL (ref 80.0–100.0)
Platelets: 100 10*3/uL — ABNORMAL LOW (ref 150–400)
RBC: 5.38 MIL/uL (ref 4.22–5.81)
RDW: 14.1 % (ref 11.5–15.5)
WBC: 37.4 10*3/uL — ABNORMAL HIGH (ref 4.0–10.5)
nRBC: 1.3 % — ABNORMAL HIGH (ref 0.0–0.2)

## 2018-12-17 LAB — COMPREHENSIVE METABOLIC PANEL
ALT: 44 U/L (ref 0–44)
AST: 106 U/L — ABNORMAL HIGH (ref 15–41)
Albumin: 1.7 g/dL — ABNORMAL LOW (ref 3.5–5.0)
Alkaline Phosphatase: 153 U/L — ABNORMAL HIGH (ref 38–126)
Anion gap: 17 — ABNORMAL HIGH (ref 5–15)
BUN: 39 mg/dL — ABNORMAL HIGH (ref 6–20)
CO2: 16 mmol/L — ABNORMAL LOW (ref 22–32)
Calcium: 8 mg/dL — ABNORMAL LOW (ref 8.9–10.3)
Chloride: 105 mmol/L (ref 98–111)
Creatinine, Ser: 5.06 mg/dL — ABNORMAL HIGH (ref 0.61–1.24)
GFR calc Af Amer: 14 mL/min — ABNORMAL LOW (ref >60)
GFR calc non Af Amer: 12 mL/min — ABNORMAL LOW (ref >60)
Glucose, Bld: 90 mg/dL (ref 70–99)
Potassium: 5.3 mmol/L — ABNORMAL HIGH (ref 3.5–5.1)
Sodium: 138 mmol/L (ref 135–145)
Total Bilirubin: 0.6 mg/dL (ref 0.3–1.2)
Total Protein: 5 g/dL — ABNORMAL LOW (ref 6.5–8.1)

## 2018-12-17 LAB — GLUCOSE, CAPILLARY
Glucose-Capillary: 94 mg/dL (ref 70–99)
Glucose-Capillary: 47 mg/dL — ABNORMAL LOW (ref 70–99)

## 2018-12-17 LAB — POCT ACTIVATED CLOTTING TIME
Activated Clotting Time: 197 seconds
Activated Clotting Time: 191 seconds
Activated Clotting Time: 169 seconds
Activated Clotting Time: 191 seconds
Activated Clotting Time: 224 seconds

## 2018-12-17 LAB — PHOSPHORUS: Phosphorus: 7.9 mg/dL — ABNORMAL HIGH (ref 2.5–4.6)

## 2018-12-17 LAB — MAGNESIUM: Magnesium: 2.7 mg/dL — ABNORMAL HIGH (ref 1.7–2.4)

## 2018-12-17 LAB — APTT: aPTT: 101 seconds — ABNORMAL HIGH (ref 24–36)

## 2018-12-17 MED ORDER — PANTOPRAZOLE SODIUM 40 MG PO PACK
40.0000 mg | PACK | Freq: Every day | ORAL | Status: DC
  Filled 2018-12-17: qty 20

## 2018-12-24 ENCOUNTER — Telehealth: Payer: Self-pay | Admitting: Pulmonary Disease

## 2018-12-24 NOTE — Telephone Encounter (Signed)
Death certificate has been placed in Dr. Lora Havens folder.

## 2018-12-24 NOTE — Telephone Encounter (Signed)
Death Certificate has been completed. Charles Yoder home aware ready for pickup.

## 2019-01-09 NOTE — Progress Notes (Signed)
Further deterioration with refractory shock. He is on maximum vent support, maximum vasopressor support. CRRT stopped this AM. I have informed pt's wife. She is appropriately despondent. She did not want to view him through camera system.   Merton Border, MD PCCM service Mobile [phone] Pager [phone] 2019/01/12 10:57 AM

## 2019-01-09 NOTE — Progress Notes (Signed)
Pt steadily declining clinically this shift.  No further escalation of care per Dr. Gilford Raid.  Pt's wife Malachy Mood updated & she declined opportunity to speak to physician.  Received order from Dr. Johnney Ou to continue CRRT & am Renal Panel.  Will continue to monitor.

## 2019-01-09 NOTE — Discharge Summary (Signed)
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 is a 48 y.o. male with history of diabetes mellitus type 2 hypertension was brought to the ER after patient had an episode of loss of consciousness.  Patient states he was not feeling well last 3 days with some cough.  Has been exam diarrhea last 2 days.  Has not had any fever chills or recorded fever at home.  Has not had any recent travel or sick contacts.  Patient's cough has been productive of sputum with brownish discoloration.  Last night while patient was at home moving his bowels on the commode passed out.  Does not know exactly how long he passed out his daughter came in found him immediately and EMS was called and was brought to the ER. ED Course: In the ER patient was hypotensive tachycardic febrile with temperatures around 101 F.  Lactate was normal.  Labs revealed WBC 9.8 hemoglobin 15.8 platelets 117 AST 111 ALT 45 creatinine 2.2 with chest x-ray showing bilateral infiltrates fever.  Blood sugar was around 100.  Patient was given fluid bolus following which blood pressure improved.  EKG was showing tachycardia.  Given the present pandemic there was concern for coronavirus COVID-19 which has been ordered.  Influenza panel was negative.  Patient was empirically started on antibiotic admitted for acute respiratory failure with possible   HOSPITAL  COURSE:   2022/12/29 admitted to hospitalist service 04/03 worsening hypoxemia.  PCCM consultation.  Required intubation 04/04 nephrology consultation. Received Actemra (Tocilizumab) without discernible benefit 04/05 worsening hypoxemia requiring prone positioning, NMB.  Trial of iNO without discernible benefit and therefore discontinued 04/06 CRRT initiated. AFRVR > amiodarone initiated 04/07 continues to require very high level of ventilatory support.  100% FiO2.  On maximum dose norepinephrine plus vasopressin.  Hydrocortisone initiated.  Angiotensin II infusion initiated. Repeat trial of tocilizumab. Persistent AFRVR > repeat bolus of amiodarone. Severe hyperglycemia > Lantus iniitated January 05, 2023 Refractory, severe hypotension despite maximum doses of all vasopressors. Ultimately progressed to asystolic arrest. No ACLS administered based on DNR in event of arrest and based on futility of any further interventions  Specific COVI-19 therapies administered: Dec 29, 2022 Hydroxychloroquine + azithromycin + zinc 04/04, 04/07 Tocilizumab x 2  Cause of death:  COVID-19 infection  Contributing factors: ARDS Cytokine release syndrome Severe sepsis, septic shock AKI DM !!, hypertension  Autopsy:  No  Smoking:  No  Merton Border, MD PCCM service Mobile [phone] Pager [phone] 2019/01/05 11:14 AM

## 2019-01-09 NOTE — Progress Notes (Signed)
[facility]  [facility] Progress Note    Assessment/ Plan:    1. Refractory septic shock d/t COVID-19 and CAP: on azithro, had CTX for CAP.  Required escalating doses of pressors yesterday with addition of Giapreza yesterday.  He has refractory septic shock and is no further escalation of care.  His severe hemodynamic compromise with exceptionally low MAPS renders him no longer a candidate for CRRT as risks outweigh benefits at this time.  We will stop CRRT.     2. Severe ARDS d/t COVID-19: severely hypoxic- now paralyzed, was proned yesterday, now supinated, R lung down, INO, ECMO team at [facility] contacted- not a candidate.  On plaquenil and given tocilizumab for cytokine release syndrome.      3.  Acute Kidney Injury on CKD 3: oliguric, failed Lasix challenge, progressive volume overload.No longer an appropriate candidate for CRRT so we are discontinuing therapy and further dialytic intervention at this time.  4.  Atrial fibrillation: s/p amiodarone bolus and now load. New onset this admission, likely hypoxia-induced    5.  Morbid obesity  6.  Diabetes: per primary  7.  Dispo: remains critically ill in the ICU with a poor prognosis.   Subjective:    Hemodynamics worsening on CRRT.  Pressures on aline 40s/20s currently.  No further escalation of care.     Objective:   BP (!) 86/55   Pulse (!) 117   Temp 98.5 F (36.9 C) (Oral)   Resp (!) 35   Ht _0  (1.854 m)   Wt (!) 140.5 kg   SpO2 92%   BMI 40.87 kg/m   Intake/Output Summary (Last 24 hours) at 23-Dec-2018 3007 Last data filed at December 23, 2018 0700 Gross per 24 hour  Intake 3351.2 ml  Output 1235 ml  Net 2116.2 ml   Weight change: 3.4 kg  Physical Exam: [id number] ill man, paralyzed, intubated, sedated. Remainder of exam not performed due to COVID-19 positive status, relying on examination by PCCM team  Imaging: Dg Chest Port 1 View  Result Date: 12/15/2018 CLINICAL DATA:  Hemodialysis catheter placement.   Viral pneumonia. EXAM: PORTABLE CHEST 1 VIEW COMPARISON:  Earlier same day FINDINGS: New right internal jugular central line tip in the SVC 4 cm above the right atrium. Endotracheal tube tip 6 cm above the carina. Orogastric tube enters the abdomen. Widespread pulmonary infiltrates persist, left more than right. IMPRESSION: New right internal jugular central line tip in the SVC 4 cm above the right atrium. No pneumothorax. Electronically Signed   By: Nelson Chimes M.D.   On: 12/15/2018 17:54   Dg Chest Port 1 View  Result Date: 12/15/2018 CLINICAL DATA:  Bilateral airspace disease. Dialysis catheter dysfunction. EXAM: PORTABLE CHEST 1 VIEW COMPARISON:  Single-view of the chest 12/14/2018. FINDINGS: New left IJ approach central venous catheter is in place. Tip projects just within the left brachiocephalic vein. ETT and right PICC are unchanged. Diffuse bilateral airspace disease persists. No pneumothorax. Cardiac silhouette is obscured. IMPRESSION: Left IJ catheter tip is just within the left brachiocephalic vein. Negative for pneumothorax. No marked change in diffuse bilateral airspace disease. Electronically Signed   By: Inge Rise M.D.   On: 12/15/2018 12:20    Labs: BMET Recent Labs  Lab 12/12/18 1700 12/13/18 6389 12/13/18 1635  12/14/18 0342 12/14/18 1632 12/15/18 0222 12/15/18 0334 12/15/18 1714 12/16/18 0413 12/16/18 1625 December 23, 2018 0334  NA  --  137  --    < > 140 137 137 139 139 140 139 138  K  --  3.2*  --    < > 3.7 4.8 4.8 4.8 5.6* 5.1 4.9 5.3*  CL  --  101  --   --  102  --   --  102 103 104 103 105  CO2  --  22  --   --  23  --   --  20* 19* 21* 22 16*  GLUCOSE  --  263*  --   --  138*  --   --  311* 312* 202* 140* 90  BUN  --  45*  --   --  61*  --   --  76* 84* 57* 35* 39*  CREATININE  --  4.50*  --   --  6.86*  --   --  8.77* 9.54* 6.70* 4.20* 5.06*  CALCIUM  --  7.2*  --   --  7.3*  --   --  6.8* 6.5* 7.5* 7.9* 8.0*  PHOS 5.5* 4.4 6.8*  --   --   --   --   --  10.7*  7.9* 5.1* 7.9*   < > = values in this interval not displayed.   CBC Recent Labs  Lab 12/12/18 0517  12/13/18 0541  12/14/18 0342 12/14/18 1632 12/15/18 0222 12/15/18 0334 31-Dec-2018 0334  WBC 8.2  --  14.2*  --  10.7*  --   --  18.9* 37.4*  NEUTROABS 6.0  --  11.2*  --  8.6*  --   --  16.5*  --   HGB 15.2   < > 14.6   < > 15.6 15.0 13.6 13.7 15.7  HCT 45.1   < > 43.2   < > 50.1 44.0 40.0 42.6 51.2  MCV 86.7  --  89.4  --  92.1  --   --  89.7 95.2  PLT 109*  --  103*  --  118*  --   --  119* 100*   < > = values in this interval not displayed.    Medications:    . artificial tears  1 application Both Eyes P5K  . aspirin  81 mg Per Tube Daily  . chlorhexidine gluconate (MEDLINE KIT)  15 mL Mouth Rinse BID  . Chlorhexidine Gluconate Cloth  6 each Topical Daily  . feeding supplement (PRO-STAT SUGAR FREE 64)  90 mL Per Tube Daily  . feeding supplement (VITAL HIGH PROTEIN)  1,000 mL Per Tube Q24H  . heparin injection (subcutaneous)  5,000 Units Subcutaneous Q8H  . hydrocortisone sod succinate (SOLU-CORTEF) inj  50 mg Intravenous Q6H  . hydroxychloroquine  200 mg Oral BID  . insulin aspart  0-20 Units Subcutaneous Q4H  . insulin glargine  40 Units Subcutaneous Daily  . mouth rinse  15 mL Mouth Rinse 10 times per day  . pantoprazole sodium  40 mg Per Tube Daily  . sodium chloride flush  10-40 mL Intracatheter Q12H  . zinc sulfate  220 mg Per Tube Daily      Madelon Lips, MD [facility] pgr [phone] 12-31-18, 8:07 AM

## 2019-01-09 NOTE — Progress Notes (Signed)
Dr Rosita Fire spoke with wife re patient condition. Patient wife declined a skype call or visiting due to end of life

## 2019-01-09 DEATH — deceased

## 2019-05-24 IMAGING — DX PORTABLE CHEST - 1 VIEW
1 series · 1 of 1 positions shown · non-contrast
Comparison: December 12, 2018

CLINICAL DATA: Worsening respiratory failure.

EXAM:
PORTABLE CHEST 1 VIEW

[chest]
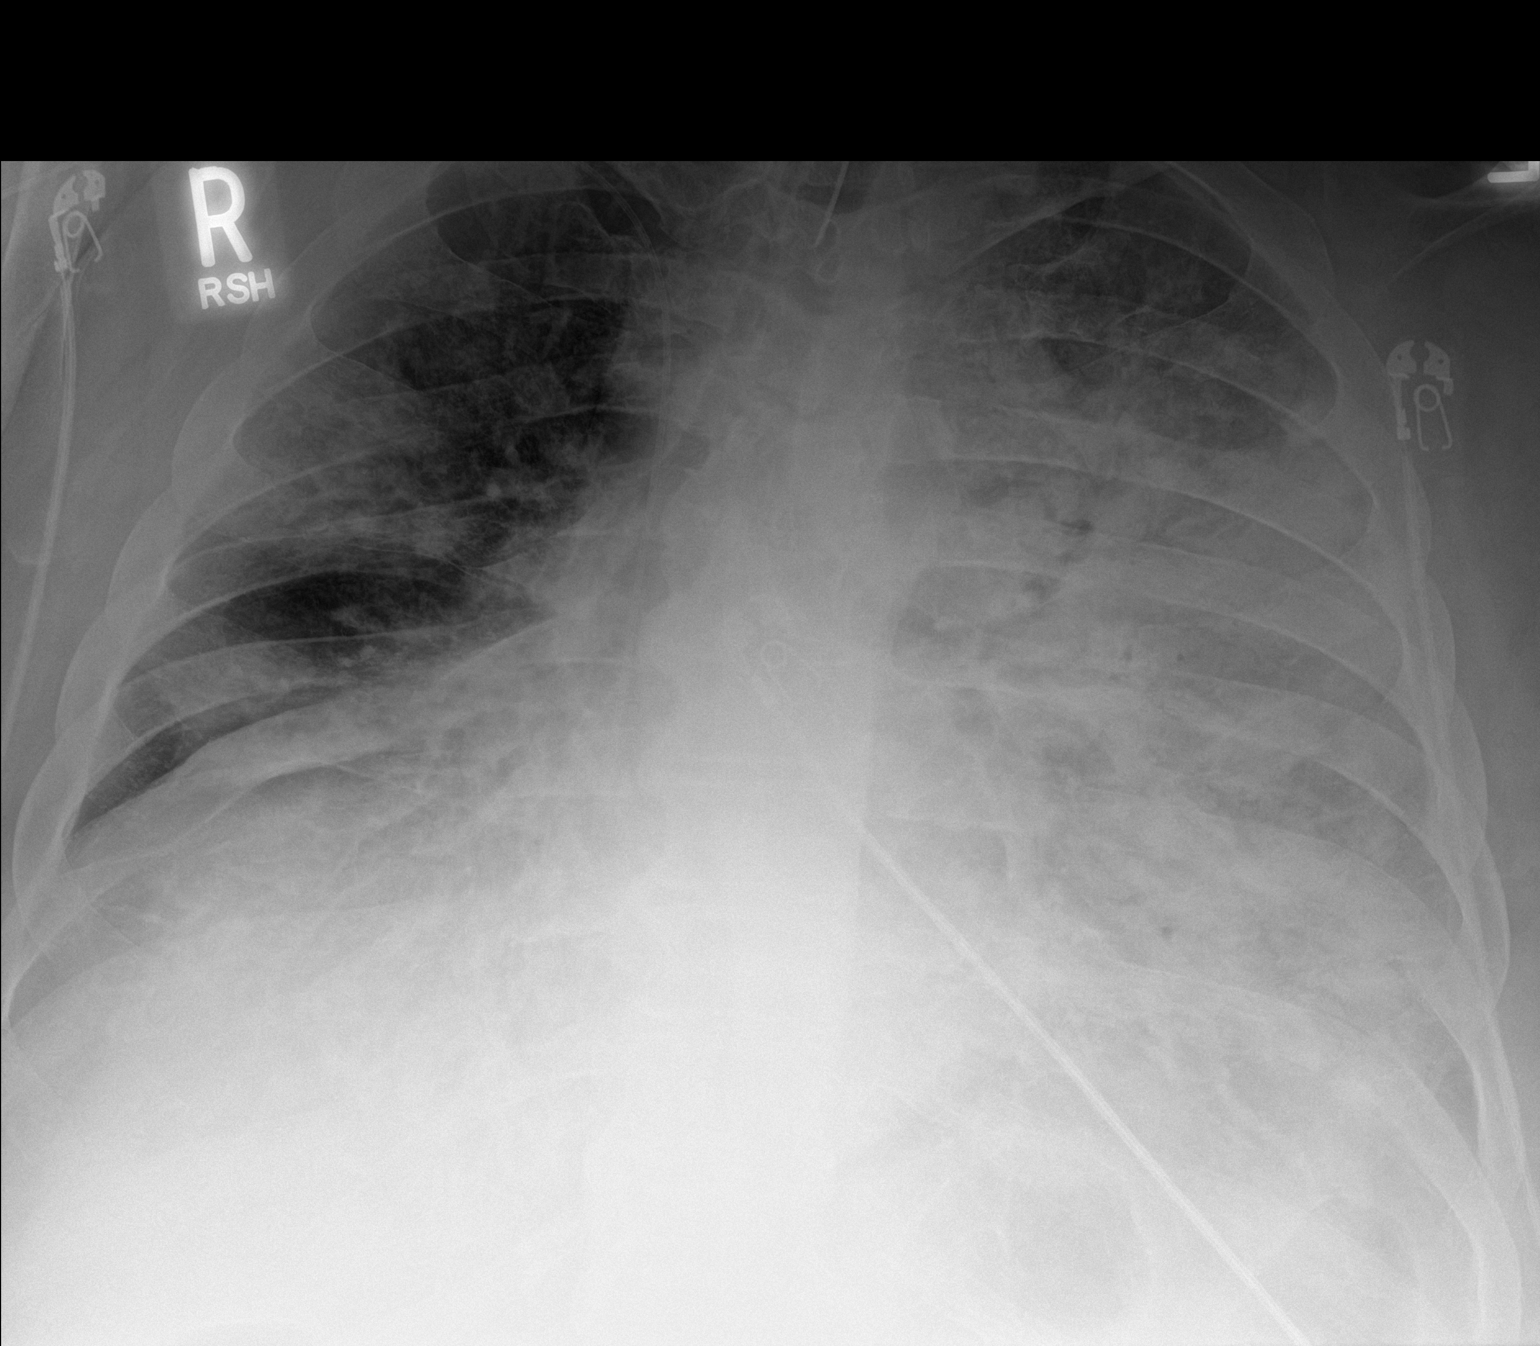

[1 of 1 positions shown; findings below may reference images not displayed]

FINDINGS: The ETT is in good position. The right PICC line terminates
centrally, likely in the central SVC. No pneumothorax. Significantly
worsened infiltrate in the left mid lower lung. Patchy infiltrate on
the right is mildly worsened. No other interval changes.
IMPRESSION: 1. Support apparatus as above.
2. Worsening bilateral pulmonary infiltrates, particularly on the
left.

## 2019-05-25 IMAGING — CR PORTABLE CHEST - 1 VIEW
2 series · 2 of 2 positions shown · non-contrast
Comparison: Single-view of the chest 12/14/2018.

CLINICAL DATA: Bilateral airspace disease. Dialysis catheter
dysfunction.

EXAM:
PORTABLE CHEST 1 VIEW

[AP (1 of 2)]
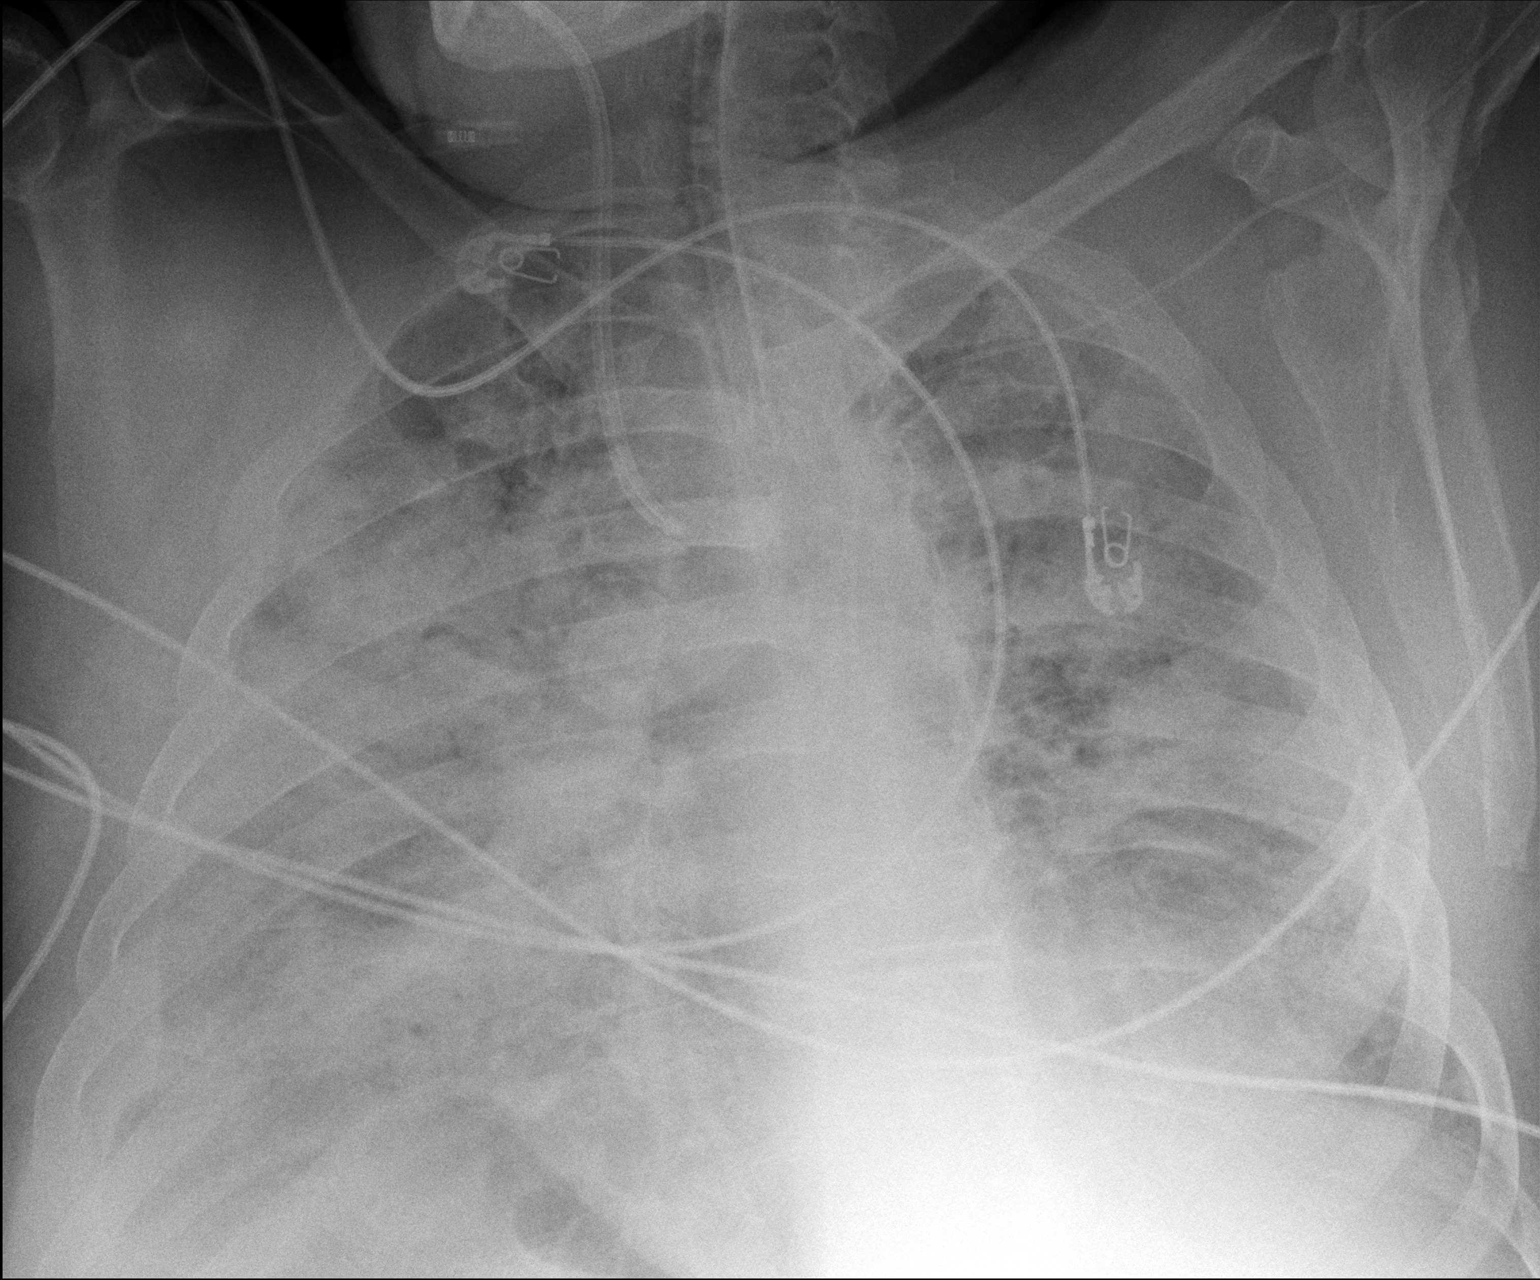

[AP (2 of 2)]
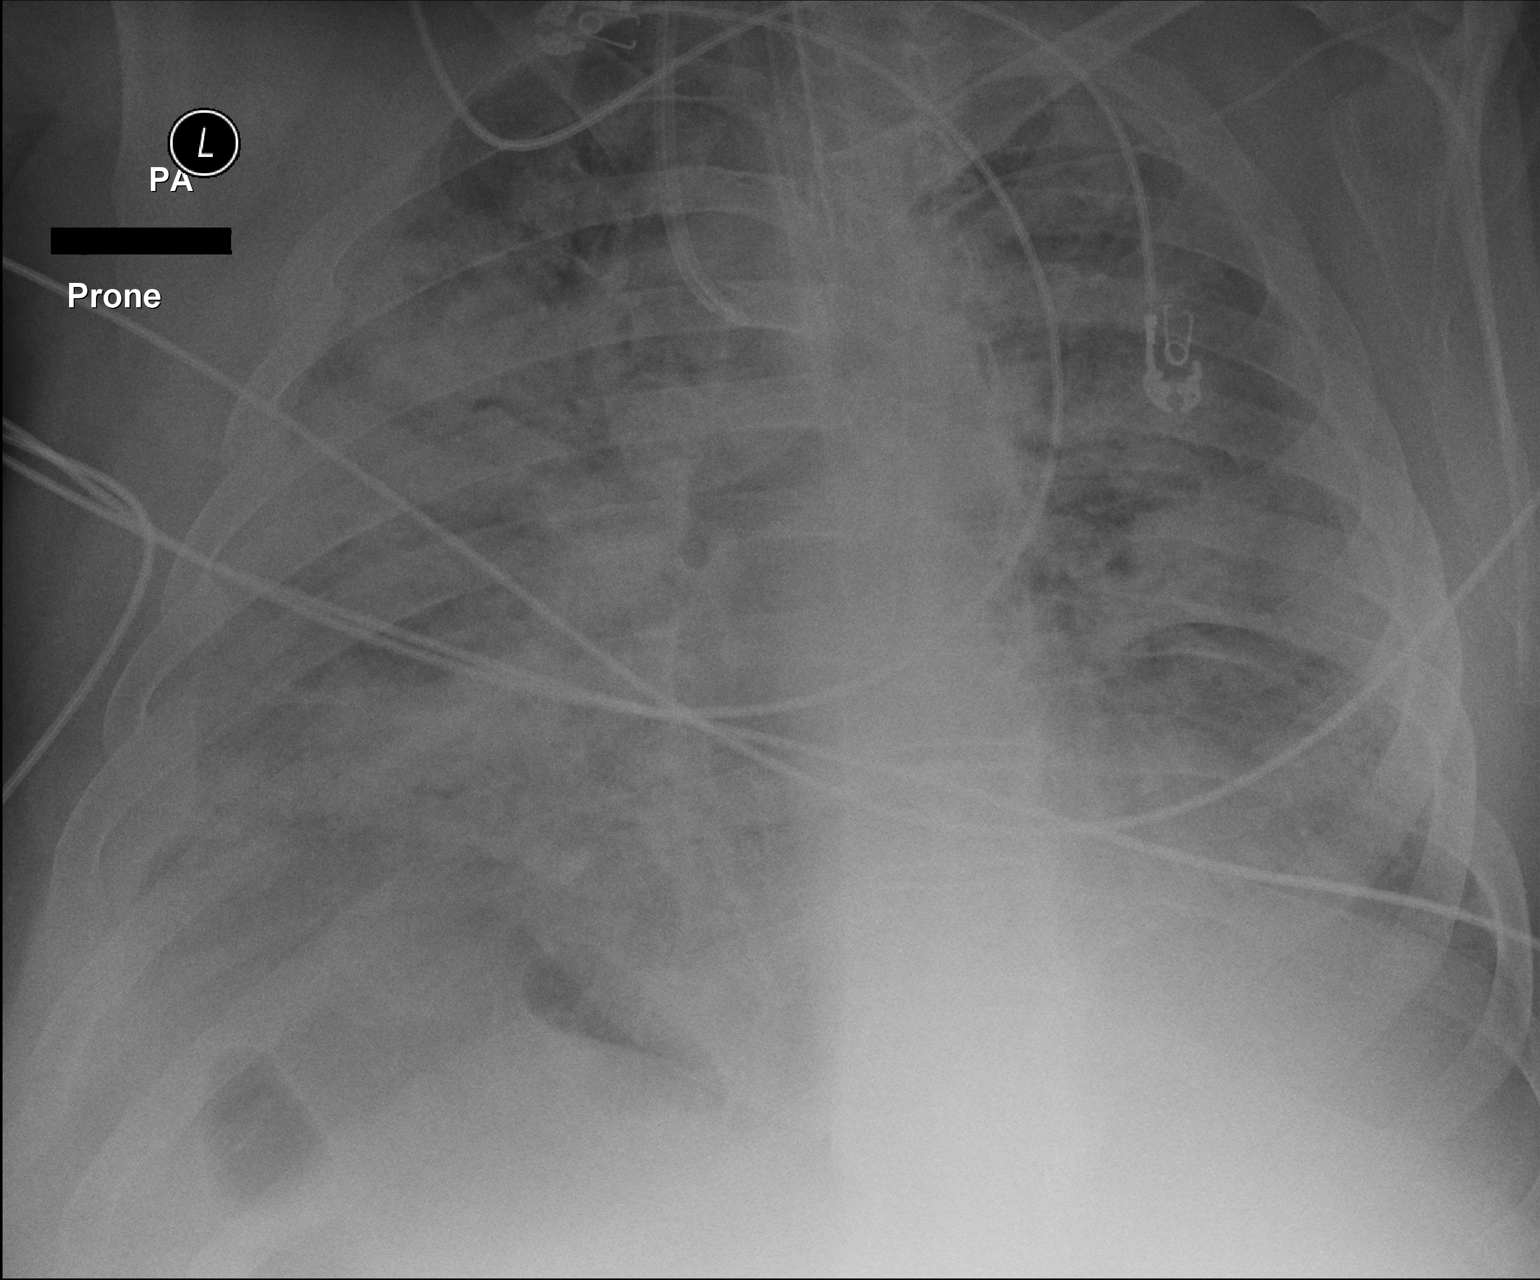

[2 of 2 positions shown; findings below may reference images not displayed]

FINDINGS: New left IJ approach central venous catheter is in place. Tip
projects just within the left brachiocephalic vein. ETT and right
PICC are unchanged. Diffuse bilateral airspace disease persists. No
pneumothorax. Cardiac silhouette is obscured.
IMPRESSION: Left IJ catheter tip is just within the left brachiocephalic vein.
Negative for pneumothorax.

No marked change in diffuse bilateral airspace disease.
